# Patient Record
Sex: Female | Born: 1955 | Race: White | Hispanic: No | Marital: Married | Smoking: Former smoker
Health system: Southern US, Community
[De-identification: ages and names within clinical notes are randomized; demographics above are authoritative.]

## PROBLEM LIST (undated history)

## (undated) DIAGNOSIS — J309 Allergic rhinitis, unspecified: Secondary | ICD-10-CM

## (undated) DIAGNOSIS — F410 Panic disorder [episodic paroxysmal anxiety] without agoraphobia: Secondary | ICD-10-CM

## (undated) DIAGNOSIS — F32A Depression, unspecified: Secondary | ICD-10-CM

## (undated) DIAGNOSIS — I10 Essential (primary) hypertension: Secondary | ICD-10-CM

## (undated) DIAGNOSIS — N189 Chronic kidney disease, unspecified: Secondary | ICD-10-CM

## (undated) DIAGNOSIS — E119 Type 2 diabetes mellitus without complications: Secondary | ICD-10-CM

## (undated) DIAGNOSIS — E669 Obesity, unspecified: Secondary | ICD-10-CM

## (undated) DIAGNOSIS — E559 Vitamin D deficiency, unspecified: Secondary | ICD-10-CM

## (undated) DIAGNOSIS — T7840XA Allergy, unspecified, initial encounter: Secondary | ICD-10-CM

## (undated) DIAGNOSIS — D689 Coagulation defect, unspecified: Secondary | ICD-10-CM

## (undated) DIAGNOSIS — M1990 Unspecified osteoarthritis, unspecified site: Secondary | ICD-10-CM

## (undated) DIAGNOSIS — D649 Anemia, unspecified: Secondary | ICD-10-CM

## (undated) DIAGNOSIS — F419 Anxiety disorder, unspecified: Secondary | ICD-10-CM

## (undated) DIAGNOSIS — E785 Hyperlipidemia, unspecified: Secondary | ICD-10-CM

## (undated) DIAGNOSIS — F319 Bipolar disorder, unspecified: Secondary | ICD-10-CM

## (undated) DIAGNOSIS — K219 Gastro-esophageal reflux disease without esophagitis: Secondary | ICD-10-CM

## (undated) HISTORY — DX: Gastro-esophageal reflux disease without esophagitis: K21.9

## (undated) HISTORY — DX: Essential (primary) hypertension: I10

## (undated) HISTORY — DX: Panic disorder (episodic paroxysmal anxiety): F41.0

## (undated) HISTORY — DX: Hyperlipidemia, unspecified: E78.5

## (undated) HISTORY — DX: Vitamin D deficiency, unspecified: E55.9

## (undated) HISTORY — DX: Unspecified osteoarthritis, unspecified site: M19.90

## (undated) HISTORY — DX: Allergy, unspecified, initial encounter: T78.40XA

## (undated) HISTORY — DX: Chronic kidney disease, unspecified: N18.9

## (undated) HISTORY — PX: JOINT REPLACEMENT: SHX530

## (undated) HISTORY — DX: Type 2 diabetes mellitus without complications: E11.9

## (undated) HISTORY — DX: Bipolar disorder, unspecified: F31.9

## (undated) HISTORY — DX: Anxiety disorder, unspecified: F41.9

## (undated) HISTORY — DX: Depression, unspecified: F32.A

## (undated) HISTORY — DX: Obesity, unspecified: E66.9

## (undated) HISTORY — DX: Coagulation defect, unspecified: D68.9

## (undated) HISTORY — DX: Allergic rhinitis, unspecified: J30.9

## (undated) HISTORY — DX: Anemia, unspecified: D64.9

---

## 2003-05-24 HISTORY — PX: HERNIA REPAIR: SHX51

## 2003-05-24 HISTORY — PX: CHOLECYSTECTOMY: SHX55

## 2004-08-11 ENCOUNTER — Ambulatory Visit: Payer: Self-pay | Admitting: Family Medicine

## 2004-09-30 ENCOUNTER — Ambulatory Visit: Payer: Self-pay | Admitting: Family Medicine

## 2004-10-14 ENCOUNTER — Ambulatory Visit: Payer: Self-pay | Admitting: Family Medicine

## 2015-05-31 DIAGNOSIS — R079 Chest pain, unspecified: Secondary | ICD-10-CM

## 2015-05-31 HISTORY — DX: Chest pain, unspecified: R07.9

## 2016-02-09 DIAGNOSIS — R000 Tachycardia, unspecified: Secondary | ICD-10-CM | POA: Insufficient documentation

## 2016-02-09 DIAGNOSIS — E785 Hyperlipidemia, unspecified: Secondary | ICD-10-CM

## 2016-02-09 DIAGNOSIS — Z86711 Personal history of pulmonary embolism: Secondary | ICD-10-CM

## 2016-02-09 HISTORY — DX: Personal history of pulmonary embolism: Z86.711

## 2016-02-09 HISTORY — DX: Tachycardia, unspecified: R00.0

## 2016-02-09 HISTORY — DX: Hyperlipidemia, unspecified: E78.5

## 2021-10-15 ENCOUNTER — Other Ambulatory Visit: Payer: Self-pay

## 2021-10-15 DIAGNOSIS — N1832 Chronic kidney disease, stage 3b: Secondary | ICD-10-CM

## 2021-11-04 ENCOUNTER — Ambulatory Visit
Admission: RE | Admit: 2021-11-04 | Discharge: 2021-11-04 | Disposition: A | Discharge status: Home / Self Care | Payer: Self-pay | Source: Ambulatory Visit | Attending: Nephrology | Admitting: Nephrology

## 2021-11-04 DIAGNOSIS — N1832 Chronic kidney disease, stage 3b: Secondary | ICD-10-CM

## 2021-11-09 ENCOUNTER — Other Ambulatory Visit: Payer: Self-pay | Admitting: Nephrology

## 2021-11-09 DIAGNOSIS — N2889 Other specified disorders of kidney and ureter: Secondary | ICD-10-CM

## 2021-11-11 ENCOUNTER — Other Ambulatory Visit (HOSPITAL_COMMUNITY): Payer: Self-pay | Admitting: Nephrology

## 2021-11-11 DIAGNOSIS — N2889 Other specified disorders of kidney and ureter: Secondary | ICD-10-CM

## 2021-11-20 ENCOUNTER — Ambulatory Visit (HOSPITAL_COMMUNITY)
Admission: RE | Admit: 2021-11-20 | Discharge: 2021-11-20 | Disposition: A | Discharge status: Home / Self Care | Payer: Medicare HMO | Source: Ambulatory Visit | Attending: Nephrology | Admitting: Nephrology

## 2021-11-20 DIAGNOSIS — N2889 Other specified disorders of kidney and ureter: Secondary | ICD-10-CM | POA: Insufficient documentation

## 2021-11-20 MED ORDER — GADOBUTROL 1 MMOL/ML IV SOLN
10.0000 mL | Freq: Once | INTRAVENOUS | Status: AC | PRN
  Administered 2021-11-20: 10 mL via INTRAVENOUS

## 2021-12-27 ENCOUNTER — Telehealth: Payer: Self-pay

## 2021-12-27 NOTE — Telephone Encounter (Signed)
New patient appointment has been scheduled and mychart message was sent to patient with appointment information, along with new patient paperwork.

## 2022-01-07 LAB — HM DIABETES EYE EXAM

## 2022-01-20 ENCOUNTER — Encounter: Payer: Self-pay | Admitting: Cardiology

## 2022-01-20 ENCOUNTER — Encounter: Payer: Self-pay | Admitting: *Deleted

## 2022-01-20 DIAGNOSIS — E785 Hyperlipidemia, unspecified: Secondary | ICD-10-CM | POA: Insufficient documentation

## 2022-01-20 DIAGNOSIS — F319 Bipolar disorder, unspecified: Secondary | ICD-10-CM | POA: Insufficient documentation

## 2022-01-20 DIAGNOSIS — I10 Essential (primary) hypertension: Secondary | ICD-10-CM | POA: Insufficient documentation

## 2022-01-20 DIAGNOSIS — F419 Anxiety disorder, unspecified: Secondary | ICD-10-CM | POA: Insufficient documentation

## 2022-01-20 DIAGNOSIS — E119 Type 2 diabetes mellitus without complications: Secondary | ICD-10-CM | POA: Insufficient documentation

## 2022-01-20 DIAGNOSIS — K219 Gastro-esophageal reflux disease without esophagitis: Secondary | ICD-10-CM | POA: Insufficient documentation

## 2022-01-20 DIAGNOSIS — F410 Panic disorder [episodic paroxysmal anxiety] without agoraphobia: Secondary | ICD-10-CM | POA: Insufficient documentation

## 2022-01-20 DIAGNOSIS — E1122 Type 2 diabetes mellitus with diabetic chronic kidney disease: Secondary | ICD-10-CM | POA: Insufficient documentation

## 2022-01-27 ENCOUNTER — Ambulatory Visit: Payer: Medicare HMO | Admitting: Nurse Practitioner

## 2022-02-07 ENCOUNTER — Ambulatory Visit: Payer: Medicare HMO | Admitting: Cardiology

## 2022-02-07 ENCOUNTER — Telehealth: Payer: Self-pay

## 2022-02-07 ENCOUNTER — Ambulatory Visit: Payer: Medicare HMO | Attending: Cardiology | Admitting: Cardiology

## 2022-02-07 ENCOUNTER — Encounter: Payer: Self-pay | Admitting: Cardiology

## 2022-02-07 VITALS — BP 128/80 | HR 67 | Ht 62.0 in | Wt 202.0 lb

## 2022-02-07 DIAGNOSIS — Z86711 Personal history of pulmonary embolism: Secondary | ICD-10-CM

## 2022-02-07 DIAGNOSIS — R0609 Other forms of dyspnea: Secondary | ICD-10-CM | POA: Diagnosis not present

## 2022-02-07 DIAGNOSIS — I10 Essential (primary) hypertension: Secondary | ICD-10-CM

## 2022-02-07 DIAGNOSIS — Z01810 Encounter for preprocedural cardiovascular examination: Secondary | ICD-10-CM

## 2022-02-07 DIAGNOSIS — F419 Anxiety disorder, unspecified: Secondary | ICD-10-CM

## 2022-02-07 DIAGNOSIS — E785 Hyperlipidemia, unspecified: Secondary | ICD-10-CM

## 2022-02-07 DIAGNOSIS — E119 Type 2 diabetes mellitus without complications: Secondary | ICD-10-CM

## 2022-02-07 NOTE — Patient Instructions (Signed)
Medication Instructions:  Your physician recommends that you continue on your current medications as directed. Please refer to the Current Medication list given to you today.  *If you need a refill on your cardiac medications before your next appointment, please call your pharmacy*   Lab Work: None ordered If you have labs (blood work) drawn today and your tests are completely normal, you will receive your results only by: [place] (if you have MyChart) OR A paper copy in the mail If you have any lab test that is abnormal or we need to change your treatment, we will call you to review the results.   Testing/Procedures: Your physician has requested that you have a lexiscan myoview. For further information please visit [email or web address]. Please follow instruction sheet, as given.  The test will take approximately 3 to 4 hours to complete; you may bring reading material.  If someone comes with you to your appointment, they will need to remain in the main lobby due to limited space in the testing area.   How to prepare for your Myocardial Perfusion Test: Do not eat or drink 3 hours prior to your test, except you may have water. Do not consume products containing caffeine (regular or decaffeinated) 12 hours prior to your test. (ex: coffee, chocolate, sodas, tea). Do bring a list of your current medications with you.  If not listed below, you may take your medications as normal. Do wear comfortable clothes (no dresses or overalls) and walking shoes, tennis shoes preferred (No heels or open toe shoes are allowed). Do NOT wear cologne, perfume, aftershave, or lotions (deodorant is allowed). If these instructions are not followed, your test will have to be rescheduled.  Your physician has requested that you have an echocardiogram. Echocardiography is a painless test that uses sound waves to create images of your heart. It provides your doctor with information about the size and shape of  your heart and how well your heart's chambers and valves are working. This procedure takes approximately one hour. There are no restrictions for this procedure.   Follow-Up: At [facility], you and your health needs are our priority.  As part of our continuing mission to provide you with exceptional heart care, we have created designated Provider Care Teams.  These Care Teams include your primary Cardiologist (physician) and Advanced Practice Providers (APPs -  Physician Assistants and Nurse Practitioners) who all work together to provide you with the care you need, when you need it.  We recommend signing up for the patient portal called "MyChart".  Sign up information is provided on this After Visit Summary.  MyChart is used to connect with patients for Virtual Visits (Telemedicine).  Patients are able to view lab/test results, encounter notes, upcoming appointments, etc.  Non-urgent messages can be sent to your provider as well.   To learn more about what you can do with MyChart, go to [email or web address].    Your next appointment:   5 month(s)  The format for your next appointment:   In Person  Provider:   Jenne Campus, MD   Other Instructions Cardiac Nuclear Scan A cardiac nuclear scan is a test that is done to check the flow of blood to your heart. It is done when you are resting and when you are exercising. The test looks for problems such as: Not enough blood reaching a portion of the heart. The heart muscle not working as it should. You may need this test if: You have heart disease. You have  had lab results that are not normal. You have had heart surgery or a balloon procedure to open up blocked arteries (angioplasty). You have chest pain. You have shortness of breath. In this test, a special dye (tracer) is put into your bloodstream. The tracer will travel to your heart. A camera will then take pictures of your heart to see how the tracer moves through your heart.  This test is usually done at a hospital and takes 2-4 hours. Tell a doctor about: Any allergies you have. All medicines you are taking, including vitamins, herbs, eye drops, creams, and over-the-counter medicines. Any problems you or family members have had with anesthetic medicines. Any blood disorders you have. Any surgeries you have had. Any medical conditions you have. Whether you are pregnant or may be pregnant. What are the risks? Generally, this is a safe test. However, problems may occur, such as: Serious chest pain and heart attack. This is only a risk if the stress portion of the test is done. Rapid heartbeat. A feeling of warmth in your chest. This feeling usually does not last long. Allergic reaction to the tracer. What happens before the test? Ask your doctor about changing or stopping your normal medicines. This is important. Follow instructions from your doctor about what you cannot eat or drink. Remove your jewelry on the day of the test. What happens during the test? An IV tube will be inserted into one of your veins. Your doctor will give you a small amount of tracer through the IV tube. You will wait for 20-40 minutes while the tracer moves through your bloodstream. Your heart will be monitored with an electrocardiogram (ECG). You will lie down on an exam table. Pictures of your heart will be taken for about 15-20 minutes. You may also have a stress test. For this test, one of these things may be done: You will be asked to exercise on a treadmill or a stationary bike. You will be given medicines that will make your heart work harder. This is done if you are unable to exercise. When blood flow to your heart has peaked, a tracer will again be given through the IV tube. After 20-40 minutes, you will get back on the exam table. More pictures will be taken of your heart. Depending on the tracer that is used, more pictures may need to be taken 3-4 hours later. Your IV  tube will be removed when the test is over. The test may vary among doctors and hospitals. What happens after the test? Ask your doctor: Whether you can return to your normal schedule, including diet, activities, and medicines. Whether you should drink more fluids. This will help to remove the tracer from your body. Drink enough fluid to keep your pee (urine) pale yellow. Ask your doctor, or the department that is doing the test: When will my results be ready? How will I get my results? Summary A cardiac nuclear scan is a test that is done to check the flow of blood to your heart. Tell your doctor whether you are pregnant or may be pregnant. Before the test, ask your doctor about changing or stopping your normal medicines. This is important. Ask your doctor whether you can return to your normal activities. You may be asked to drink more fluids. This information is not intended to replace advice given to you by your health care provider. Make sure you discuss any questions you have with your health care provider. Document Revised: 08/29/2018 Document Reviewed: 10/23/2017  Elsevier Patient Education  2021 [place].    Echocardiogram An echocardiogram is a test that uses sound waves (ultrasound) to produce images of the heart. Images from an echocardiogram can provide important information about: Heart size and shape. The size and thickness and movement of your heart's walls. Heart muscle function and strength. Heart valve function or if you have stenosis. Stenosis is when the heart valves are too narrow. If blood is flowing backward through the heart valves (regurgitation). A tumor or infectious growth around the heart valves. Areas of heart muscle that are not working well because of poor blood flow or injury from a heart attack. Aneurysm detection. An aneurysm is a weak or damaged part of an artery wall. The wall bulges out from the normal force of blood pumping through the  body. Tell a health care provider about: Any allergies you have. All medicines you are taking, including vitamins, herbs, eye drops, creams, and over-the-counter medicines. Any blood disorders you have. Any surgeries you have had. Any medical conditions you have. Whether you are pregnant or may be pregnant. What are the risks? Generally, this is a safe test. However, problems may occur, including an allergic reaction to dye (contrast) that may be used during the test. What happens before the test? No specific preparation is needed. You may eat and drink normally. What happens during the test? You will take off your clothes from the waist up and put on a hospital gown. Electrodes or electrocardiogram (ECG)patches may be placed on your chest. The electrodes or patches are then connected to a device that monitors your heart rate and rhythm. You will lie down on a table for an ultrasound exam. A gel will be applied to your chest to help sound waves pass through your skin. A handheld device, called a transducer, will be pressed against your chest and moved over your heart. The transducer produces sound waves that travel to your heart and bounce back (or "echo" back) to the transducer. These sound waves will be captured in real-time and changed into images of your heart that can be viewed on a video monitor. The images will be recorded on a computer and reviewed by your health care provider. You may be asked to change positions or hold your breath for a short time. This makes it easier to get different views or better views of your heart. In some cases, you may receive contrast through an IV in one of your veins. This can improve the quality of the pictures from your heart. The procedure may vary among health care providers and hospitals.    What can I expect after the test? You may return to your normal, everyday life, including diet, activities, and medicines, unless your health care provider tells  you not to do that. Follow these instructions at home: It is up to you to get the results of your test. Ask your health care provider, or the department that is doing the test, when your results will be ready. Keep all follow-up visits. This is important. Summary An echocardiogram is a test that uses sound waves (ultrasound) to produce images of the heart. Images from an echocardiogram can provide important information about the size and shape of your heart, heart muscle function, heart valve function, and other possible heart problems. You do not need to do anything to prepare before this test. You may eat and drink normally. After the echocardiogram is completed, you may return to your normal, everyday life, unless your  health care provider tells you not to do that. This information is not intended to replace advice given to you by your health care provider. Make sure you discuss any questions you have with your health care provider. Document Revised: 12/31/2019 Document Reviewed: 12/31/2019 Elsevier Patient Education  2021 [employer].

## 2022-02-07 NOTE — Telephone Encounter (Signed)
   [facility] Pre-operative Risk Assessment    Request for surgical clearance:  What type of surgery is being performed? Right TKR   When is this surgery scheduled? TBD   What type of clearance is required (medical clearance vs. Pharmacy clearance to hold med vs. Both)? Both  Are there any medications that need to be held prior to surgery and how long?Not specified   Practice name and name of physician performing surgery? Dr. Lara Mulch at [facility]   What is your office phone number: [phone]    7.   What is your office fax number: [phone] and [phone]  8.   Anesthesia type (None, local, MAC, general) ? Spinal Anesthesia    Basil Dess Rudra Hobbins 02/07/2022, 1:53 PM  _________________________________________________________________   (provider comments below)

## 2022-02-07 NOTE — Progress Notes (Unsigned)
Cardiology Consultation:    Date:  02/07/2022   ID:  Dorris Fetch, DOB Nov 10, 1955, MRN [id number]  PCP:  Rhea Bleacher, NP  Cardiologist:  Jenne Campus, MD   Referring MD: Rhea Bleacher, NP   Chief Complaint  Patient presents with   CLearance TBD    Dr. Lara Mulch, Right total knee replacement     History of Present Illness:    Rachel Russell is a 66 y.o. female who is being seen today for the evaluation of cardiovascular valuation before elective right knee replacement surgery at the request of Rhea Bleacher, NP.  I had a chance to take care of her about 8 years ago.  She presented to [place] because of tachycardia.  Echocardiogram has been performed which showed preserved left ventricle ejection fraction.  Her past medical history includes pulmonary emboli that she suffered from in 2006, essential hypertension, dyslipidemia, diabetes.  Overall her ability to exercise is very limited and the multiple reasons for that.  Obviously right knee pain is significant.  She described to have some shortness of breath with exertion however will stop her from walking more is pain in right knee rather than chest pain tightness squeezing pressure burning chest.  She denies have any palpitations, there is no swelling of lower extremities.  She does not smoke, never did.  Does have dyslipidemia and she is on pravastatin but I do not have her last fasting lipid profile.  Past Medical History:  Diagnosis Date   Allergic rhinitis    Anxiety disorder    Bipolar disorder ([place])    Chest pain 05/31/2015   Formatting of this note might be different from the original. Seen at [facility] ED 04/12/15 with normal EKG, troponin-2, CTA chest and discharged   Dyslipidemia 02/09/2016   Essential (primary) hypertension    GERD without esophagitis    History of pulmonary embolism 02/09/2016   Hyperlipidemia    Obesity    Panic disorder (episodic paroxysmal anxiety)    Tachycardia 02/09/2016   Type 2 diabetes  mellitus without complication ([place])    Vitamin D deficiency     Past Surgical History:  Procedure Laterality Date   CHOLECYSTECTOMY  2005    Current Medications: Current Meds  Medication Sig   famotidine (PEPCID) 20 MG tablet Take 1 tablet by mouth daily.   fexofenadine (ALLEGRA) 180 MG tablet Take 180 mg by mouth daily.   hydrochlorothiazide (HYDRODIURIL) 12.5 MG tablet Take 12.5 mg by mouth daily.   lamoTRIgine (LAMICTAL) 100 MG tablet Take 300 mg by mouth daily. 1 in am and 2 in pm   LORazepam (ATIVAN) 0.5 MG tablet Take 0.5 mg by mouth 2 (two) times daily as needed for anxiety.   metFORMIN (GLUCOPHAGE-XR) 500 MG 24 hr tablet Take 500 mg by mouth daily.   PARoxetine (PAXIL) 40 MG tablet Take 40 mg by mouth at bedtime.   pravastatin (PRAVACHOL) 80 MG tablet Take 80 mg by mouth daily.   propranolol ER (INDERAL LA) 60 MG 24 hr capsule Take 60 mg by mouth daily.   Rivaroxaban (XARELTO) 15 MG TABS tablet Take 15 mg by mouth daily with supper.   traZODone (DESYREL) 50 MG tablet Take 150 mg by mouth at bedtime as needed for sleep.     Allergies:   Iodinated contrast media, Shellfish-derived products, Codeine, Latex, and Penicillins   Social History   Socioeconomic History   Marital status: Married    Spouse name: Not on file   Number  of children: Not on file   Years of education: Not on file   Highest education level: Some college, no degree  Occupational History   Not on file  Tobacco Use   Smoking status: Former    Years: 5.00    Types: Cigarettes    Quit date: 11/2018    Years since quitting: 3.2   Smokeless tobacco: Never  Vaping Use   Vaping Use: Never used  Substance and Sexual Activity   Alcohol use: Never   Drug use: Never   Sexual activity: Not on file  Other Topics Concern   Not on file  Social History Narrative   Not on file   Social Determinants of Health   Financial Resource Strain: High Risk (12/27/2021)   Overall Financial Resource Strain (CARDIA)     Difficulty of Paying Living Expenses: Hard  Food Insecurity: Food Insecurity Present (12/27/2021)   Hunger Vital Sign    Worried About Running Out of Food in the Last Year: Sometimes true    Ran Out of Food in the Last Year: Sometimes true  Transportation Needs: No Transportation Needs (12/27/2021)   PRAPARE - Administrator, Civil Service (Medical): No    Lack of Transportation (Non-Medical): No  Physical Activity: Unknown (12/27/2021)   Exercise Vital Sign    Days of Exercise per Week: 0 days    Minutes of Exercise per Session: Not on file  Stress: Stress Concern Present (12/27/2021)   [employer] of Occupational Health - Occupational Stress Questionnaire    Feeling of Stress : Very much  Social Connections: Moderately Isolated (12/27/2021)   Social Connection and Isolation Panel [NHANES]    Frequency of Communication with Friends and Family: Three times a week    Frequency of Social Gatherings with Friends and Family: Never    Attends Religious Services: Never    Database administrator or Organizations: No    Attends Engineer, structural: Not on file    Marital Status: Married     Family History: The patient's family history includes Asthma in her brother; Diabetes in her father; Heart disease in her father and mother; Leukemia in her mother. ROS:   Please see the history of present illness.    All 14 point review of systems negative except as described per history of present illness.  EKGs/Labs/Other Studies Reviewed:    The following studies were reviewed today:   EKG:  EKG is  ordered today.  The ekg ordered today demonstrates normal sinus rhythm normal P interval normal QS complex duration fulgent no ST segment changes  Recent Labs: No results found for requested labs within last 365 days.  Recent Lipid Panel No results found for: "CHOL", "TRIG", "HDL", "CHOLHDL", "VLDL", "LDLCALC", "LDLDIRECT"  Physical Exam:    VS:  BP 128/80 (BP Location: Left  Arm, Patient Position: Sitting)   Pulse 67   Ht 5\' 2"  (1.575 m)   Wt 202 lb (91.6 kg)   SpO2 99%   BMI 36.95 kg/m     Wt Readings from Last 3 Encounters:  02/07/22 202 lb (91.6 kg)  01/05/22 203 lb (92.1 kg)     GEN:  Well nourished, well developed in no acute distress HEENT: Normal NECK: No JVD; No carotid bruits LYMPHATICS: No lymphadenopathy CARDIAC: RRR, no murmurs, no rubs, no gallops RESPIRATORY:  Clear to auscultation without rales, wheezing or rhonchi  ABDOMEN: Soft, non-tender, non-distended MUSCULOSKELETAL:  No edema; No deformity  SKIN: Warm and dry  NEUROLOGIC:  Alert and oriented x 3 PSYCHIATRIC:  Normal affect   ASSESSMENT:    1. History of pulmonary embolism   2. Dyslipidemia   3. Essential (primary) hypertension   4. Type 2 diabetes mellitus without complication, without long-term current use of insulin ([place])   5. Anxiety disorder, unspecified type    PLAN:    In order of problems listed above:  History of pulmonary emboli.  She is anticoagulated which I will continue.  I will get echocardiogram to assess right ventricle size and function. This could edema she is on pravastatin which I will continue we will call primary care physician to get fasting lipid profile Essential hypertension blood pressure well controlled continue present management Type 2 diabetes stable apparently Anxiety/panic disorder can contribute to her symptomatology. Cardiovascular evaluation before elective knee replacement surgery.  I will ask her to have an echocardiogram done, we will also schedule her to have Lexiscan to make sure she does not have any inducible ischemia.  If everything is normal then we will be able to hold her Xarelto for 48 hours and proceed with surgery as scheduled.   Medication Adjustments/Labs and Tests Ordered: Current medicines are reviewed at length with the patient today.  Concerns regarding medicines are outlined above.  No orders of the defined types  were placed in this encounter.  No orders of the defined types were placed in this encounter.   Signed, Park Liter, MD, [facility]. 02/07/2022 2:23 PM    [place]

## 2022-02-08 ENCOUNTER — Telehealth (HOSPITAL_COMMUNITY): Payer: Self-pay | Admitting: *Deleted

## 2022-02-08 NOTE — Telephone Encounter (Signed)
Left message on voicemail per DPR in reference to upcoming appointment scheduled on  02/15/22 with detailed instructions given per Myocardial Perfusion Study Information Sheet for the test. LM to arrive 15 minutes early, and that it is imperative to arrive on time for appointment to keep from having the test rescheduled. If you need to cancel or reschedule your appointment, please call the office within 24 hours of your appointment. Failure to do so may result in a cancellation of your appointment, and a $50 no show fee. Phone number given for call back for any questions. Lynn Recendiz Jacqueline   

## 2022-02-09 ENCOUNTER — Ambulatory Visit: Payer: Medicare HMO | Admitting: Nurse Practitioner

## 2022-02-10 NOTE — Telephone Encounter (Signed)
Pt is having some cardiac testing, 02/15/22 & 02/17/22, clearance will be addressed at that time.  Will route to requesting surgeon's office to make them aware.

## 2022-02-15 ENCOUNTER — Ambulatory Visit: Payer: Medicare HMO

## 2022-02-17 ENCOUNTER — Telehealth (HOSPITAL_COMMUNITY): Payer: Self-pay | Admitting: *Deleted

## 2022-02-17 ENCOUNTER — Ambulatory Visit: Payer: Medicare HMO

## 2022-02-17 NOTE — Telephone Encounter (Signed)
Patient given detailed instructions per Myocardial Perfusion Study Information Sheet for the test on 02/24/2022 at 8:15. Patient notified to arrive 15 minutes early and that it is imperative to arrive on time for appointment to keep from having the test rescheduled.  If you need to cancel or reschedule your appointment, please call the office within 24 hours of your appointment. . Patient verbalized understanding.Rachel Russell

## 2022-02-23 ENCOUNTER — Encounter: Payer: Self-pay | Admitting: Nurse Practitioner

## 2022-02-23 ENCOUNTER — Ambulatory Visit (INDEPENDENT_AMBULATORY_CARE_PROVIDER_SITE_OTHER): Payer: Medicare HMO | Admitting: Nurse Practitioner

## 2022-02-23 VITALS — BP 118/78 | HR 63 | Temp 97.1°F | Ht 62.0 in | Wt 203.0 lb

## 2022-02-23 DIAGNOSIS — I10 Essential (primary) hypertension: Secondary | ICD-10-CM | POA: Diagnosis not present

## 2022-02-23 DIAGNOSIS — Z780 Asymptomatic menopausal state: Secondary | ICD-10-CM

## 2022-02-23 DIAGNOSIS — Z1231 Encounter for screening mammogram for malignant neoplasm of breast: Secondary | ICD-10-CM

## 2022-02-23 DIAGNOSIS — Z23 Encounter for immunization: Secondary | ICD-10-CM

## 2022-02-23 DIAGNOSIS — Z5320 Procedure and treatment not carried out because of patient's decision for unspecified reasons: Secondary | ICD-10-CM

## 2022-02-23 DIAGNOSIS — F419 Anxiety disorder, unspecified: Secondary | ICD-10-CM | POA: Diagnosis not present

## 2022-02-23 DIAGNOSIS — E1165 Type 2 diabetes mellitus with hyperglycemia: Secondary | ICD-10-CM | POA: Diagnosis not present

## 2022-02-23 DIAGNOSIS — E782 Mixed hyperlipidemia: Secondary | ICD-10-CM

## 2022-02-23 DIAGNOSIS — K219 Gastro-esophageal reflux disease without esophagitis: Secondary | ICD-10-CM

## 2022-02-23 DIAGNOSIS — Z13820 Encounter for screening for osteoporosis: Secondary | ICD-10-CM

## 2022-02-23 MED ORDER — TETANUS-DIPHTH-ACELL PERTUSSIS 5-2.5-18.5 LF-MCG/0.5 IM SUSP: 0.5000 mL | Freq: Once | INTRAMUSCULAR | 0 refills | Status: AC

## 2022-02-23 MED ORDER — ZOSTER VAC RECOMB ADJUVANTED 50 MCG/0.5ML IM SUSR: 0.5000 mL | Freq: Once | INTRAMUSCULAR | 0 refills | Status: AC

## 2022-02-23 NOTE — Patient Instructions (Addendum)
We will call you with lab results, mammogram, and bone mineral density exams Continue medications [facility]btain [facility] Follow-up in 38-month, pending labs    Preventive Care 65 Years and Older, Female Preventive care refers to lifestyle choices and visits with your health care provider that can promote health and wellness. Preventive care visits are also called wellness exams. What can I expect for my preventive care visit? Counseling Your health care provider may ask you questions about your: Medical history, including: Past medical problems. Family medical history. Pregnancy and menstrual history. History of falls. Current health, including: Memory and ability to understand (cognition). Emotional well-being. Home life and relationship well-being. Sexual activity and sexual health. Lifestyle, including: Alcohol, nicotine or tobacco, and drug use. Access to firearms. Diet, exercise, and sleep habits. Work and work eStatistician Sunscreen use. Safety issues such as seatbelt and bike helmet use. Physical exam Your health care provider will check your: Height and weight. These may be used to calculate your BMI (body mass index). BMI is a measurement that tells if you are at a healthy weight. Waist circumference. This measures the distance around your waistline. This measurement also tells if you are at a healthy weight and may help predict your risk of certain diseases, such as type 2 diabetes and high blood pressure. Heart rate and blood pressure. Body temperature. Skin for abnormal spots. What immunizations do I need?  Vaccines are usually given at various ages, according to a schedule. Your health care provider will recommend vaccines for you based on your age, medical history, and lifestyle or other factors, such as travel or where you work. What tests do I need? Screening Your health care provider may recommend screening tests for certain  conditions. This may include: Lipid and cholesterol levels. Hepatitis C test. Hepatitis B test. HIV (human immunodeficiency virus) test. STI (sexually transmitted infection) testing, if you are at risk. Lung cancer screening. Colorectal cancer screening. Diabetes screening. This is done by checking your blood sugar (glucose) after you have not eaten for a while (fasting). Mammogram. Talk with your health care provider about how often you should have regular mammograms. BRCA-related cancer screening. This may be done if you have a family history of breast, ovarian, tubal, or peritoneal cancers. Bone density scan. This is done to screen for osteoporosis. Talk with your health care provider about your test results, treatment options, and if necessary, the need for more tests. Follow these instructions at home: Eating and drinking  Eat a diet that includes fresh fruits and vegetables, whole grains, lean protein, and low-fat dairy products. Limit your intake of foods with high amounts of sugar, saturated fats, and salt. Take vitamin and mineral supplements as recommended by your health care provider. Do not drink alcohol if your health care provider tells you not to drink. If you drink alcohol: Limit how much you have to 0-1 drink a day. Know how much alcohol is in your drink. In the [place] one drink equals one 12 oz bottle of beer (355 mL), one 5 oz glass of wine (148 mL), or one 1 oz glass of hard liquor (44 mL). Lifestyle Brush your teeth every morning and night with fluoride toothpaste. Floss one time each day. Exercise for at least 30 minutes 5 or more days each week. Do not use any products that contain nicotine or tobacco. These products include cigarettes, chewing tobacco, and vaping devices, such as e-cigarettes. If you need help quitting, ask your health care provider.  Do not use drugs. If you are sexually active, practice safe sex. Use a condom or other form of protection in order to  prevent STIs. Take aspirin only as told by your health care provider. Make sure that you understand how much to take and what form to take. Work with your health care provider to find out whether it is safe and beneficial for you to take aspirin daily. Ask your health care provider if you need to take a cholesterol-lowering medicine (statin). Find healthy ways to manage stress, such as: Meditation, yoga, or listening to music. Journaling. Talking to a trusted person. Spending time with friends and family. Minimize exposure to UV radiation to reduce your risk of skin cancer. Safety Always wear your seat belt while driving or riding in a vehicle. Do not drive: If you have been drinking alcohol. Do not ride with someone who has been drinking. When you are tired or distracted. While texting. If you have been using any mind-altering substances or drugs. Wear a helmet and other protective equipment during sports activities. If you have firearms in your house, make sure you follow all gun safety procedures. What's next? Visit your health care provider once a year for an annual wellness visit. Ask your health care provider how often you should have your eyes and teeth checked. Stay up to date on all vaccines. This information is not intended to replace advice given to you by your health care provider. Make sure you discuss any questions you have with your health care provider. Document Revised: 11/04/2020 Document Reviewed: 11/04/2020 Elsevier Patient Education  [place].

## 2022-02-23 NOTE — Progress Notes (Signed)
New Patient Office Visit  Subjective    Patient ID: Rachel Russell, female    DOB: December 20, 1955  Age: 66 y.o. MRN: [id number]  CC:  Chief Complaint  Patient presents with   Establish Care    HPI Rachel Russell presents to establish care previous patient with [facility]. She tells me she suffers from bipolar disorder and see's Dr. Delane Ginger, currently prescribed Lamictal, Trazadone, Paxil, and Ativan.Pt reports chronic right knee pain secondary to osteoarthritis. States she has is followed by Dr Deberah Castle and plans to undergo right TKA in the near future.   Diabetes Mellitus Type II, Follow-up   Wt Readings from Last 3 Encounters:  02/23/22 203 lb (92.1 kg)  02/07/22 202 lb (91.6 kg)  01/05/22 203 lb (92.1 kg)   Management  includes Metformin 500 mg daily. She reports excellent compliance with treatment. She is not having side effects.   Most Recent Eye Exam: 01/2022  Lipid/Cholesterol, Follow-up  Last lipid panel Other pertinent labs  No results found for: "CHOL", "HDL", "LDLCALC", "LDLDIRECT", "TRIG", "CHOLHDL" No results found for: "ALT", "AST", "PLT", "TSH"   Management includes Pravastatin 80 mg QD         She reports excellent compliance with treatment. She is not having side effects.    Bipolar Depression:  Current treatment includes Trazodone, Lamictal  She reports excellent compliance with treatment. She is not having side effects.       02/23/2022   11:09 AM  Depression screen PHQ 2/9  Decreased Interest 3  Down, Depressed, Hopeless 3  PHQ - 2 Score 6  Altered sleeping 0  Tired, decreased energy 2  Change in appetite 3  Feeling bad or failure about yourself  3  Trouble concentrating 3  Moving slowly or fidgety/restless 3  Suicidal thoughts 0  PHQ-9 Score 20  Difficult doing work/chores Very difficult    Anxiety, Follow-up  Current treatment includes Ativan and Propranolol.  She reports excellent compliance with treatment. She  reports excellent tolerance of treatment. She is not having side effects.  She feels her anxiety is severe.   GAD-7 Results    02/23/2022   11:11 AM  GAD-7 Generalized Anxiety Disorder Screening Tool  1. Feeling Nervous, Anxious, or on Edge 3  2. Not Being Able to Stop or Control Worrying 3  3. Worrying Too Much About Different Things 3  4. Trouble Relaxing 0  5. Being So Restless it's Hard To Sit Still 1  6. Becoming Easily Annoyed or Irritable 3  7. Feeling Afraid As If Something Awful Might Happen 2  Total GAD-7 Score 15  Difficulty At Work, Home, or Getting  Along With Others? Very difficult      Outpatient Encounter Medications as of 02/23/2022  Medication Sig   famotidine (PEPCID) 20 MG tablet Take 1 tablet by mouth daily.   fexofenadine (ALLEGRA) 180 MG tablet Take 180 mg by mouth daily.   lamoTRIgine (LAMICTAL) 100 MG tablet Take 300 mg by mouth daily. 1 in am and 2 in pm   LORazepam (ATIVAN) 0.5 MG tablet Take 0.5 mg by mouth 2 (two) times daily as needed for anxiety.   metFORMIN (GLUCOPHAGE-XR) 500 MG 24 hr tablet Take 500 mg by mouth daily.   PARoxetine (PAXIL) 40 MG tablet Take 40 mg by mouth at bedtime.   pravastatin (PRAVACHOL) 80 MG tablet Take 80 mg by mouth daily.   propranolol ER (INDERAL LA) 60 MG 24 hr capsule Take 60 mg by mouth daily.  Rivaroxaban (XARELTO) 15 MG TABS tablet Take 15 mg by mouth daily with supper.   traZODone (DESYREL) 50 MG tablet Take 150 mg by mouth at bedtime as needed for sleep.   [DISCONTINUED] hydrochlorothiazide (HYDRODIURIL) 12.5 MG tablet Take 12.5 mg by mouth daily.   No facility-administered encounter medications on file as of 02/23/2022.    Past Medical History:  Diagnosis Date   Allergic rhinitis    Anxiety disorder    Bipolar disorder (HCC)    Chest pain 05/31/2015   Formatting of this note might be different from the original. Seen at [facility] ED 04/12/15 with normal EKG, troponin-2, CTA chest and discharged   Dyslipidemia  02/09/2016   Essential (primary) hypertension    GERD without esophagitis    History of pulmonary embolism 02/09/2016   Hyperlipidemia    Obesity    Panic disorder (episodic paroxysmal anxiety)    Tachycardia 02/09/2016   Type 2 diabetes mellitus without complication (HCC)    Vitamin D deficiency     Past Surgical History:  Procedure Laterality Date   CHOLECYSTECTOMY  2005    Family History  Problem Relation Age of Onset   Leukemia Mother    Heart disease Mother        Pacemaker   Diabetes Father    Heart disease Father        Stents   Asthma Brother     Social History   Socioeconomic History   Marital status: Married    Spouse name: Tajae Maiolo   Number of children: 3   Years of education: Not on file   Highest education level: Some college, no degree  Occupational History   Not on file  Tobacco Use   Smoking status: Former    Years: 5.00    Types: Cigarettes    Quit date: 11/2018    Years since quitting: 3.2   Smokeless tobacco: Never  Vaping Use   Vaping Use: Every day  Substance and Sexual Activity   Alcohol use: Never   Drug use: Never   Sexual activity: Not Currently  Other Topics Concern   Not on file  Social History Narrative   Not on file   Social Determinants of Health   Financial Resource Strain: High Risk (12/27/2021)   Overall Financial Resource Strain (CARDIA)    Difficulty of Paying Living Expenses: Hard  Food Insecurity: Food Insecurity Present (12/27/2021)   Hunger Vital Sign    Worried About Running Out of Food in the Last Year: Sometimes true    Ran Out of Food in the Last Year: Sometimes true  Transportation Needs: No Transportation Needs (12/27/2021)   PRAPARE - Administrator, Civil Service (Medical): No    Lack of Transportation (Non-Medical): No  Physical Activity: Inactive (02/23/2022)   Exercise Vital Sign    Days of Exercise per Week: 0 days    Minutes of Exercise per Session: 0 min  Stress: Stress Concern  Present (12/27/2021)   [employer] of Occupational Health - Occupational Stress Questionnaire    Feeling of Stress : Very much  Social Connections: Moderately Isolated (12/27/2021)   Social Connection and Isolation Panel [NHANES]    Frequency of Communication with Friends and Family: Three times a week    Frequency of Social Gatherings with Friends and Family: Never    Attends Religious Services: Never    Database administrator or Organizations: No    Attends Banker Meetings: Not on file  Marital Status: Married  Human resources officer Violence: Not At Risk (02/23/2022)   Humiliation, Afraid, Rape, and Kick questionnaire    Fear of Current or Ex-Partner: No    Emotionally Abused: No    Physically Abused: No    Sexually Abused: No    Review of Systems  Constitutional:  Negative for chills, fever and malaise/fatigue.  HENT:  Negative for ear pain, sinus pain and sore throat.   Respiratory:  Negative for cough and shortness of breath.   Cardiovascular:  Negative for chest pain.  Musculoskeletal:  Positive for joint pain (chronic, right knee) and myalgias (right knee).  Neurological:  Negative for headaches.  Endo/Heme/Allergies:  Positive for environmental allergies.  Psychiatric/Behavioral:  Positive for depression and memory loss. The patient is nervous/anxious.         Objective    BP 118/78   Pulse 63   Temp (!) 97.1 F (36.2 C)   Ht 5\' 2"  (1.575 m)   Wt 203 lb (92.1 kg)   SpO2 97%   BMI 37.13 kg/m   Physical Exam Vitals reviewed.  Constitutional:      Appearance: She is obese.  HENT:     Head: Normocephalic.     Right Ear: Tympanic membrane normal.     Left Ear: Tympanic membrane normal.     Nose: Nose normal.     Mouth/Throat:     Mouth: Mucous membranes are moist.  Eyes:     Pupils: Pupils are equal, round, and reactive to light.  Cardiovascular:     Rate and Rhythm: Normal rate and regular rhythm.  Pulmonary:     Effort: Pulmonary effort is  normal.     Breath sounds: Normal breath sounds.  Abdominal:     General: Bowel sounds are normal.     Palpations: Abdomen is soft.  Musculoskeletal:        General: Swelling (right knee) and tenderness (right knee) present.  Skin:    General: Skin is warm and dry.     Capillary Refill: Capillary refill takes less than 2 seconds.  Neurological:     General: No focal deficit present.     Mental Status: She is alert and oriented to person, place, and time.  Psychiatric:        Mood and Affect: Mood normal.        Behavior: Behavior normal.       Assessment & Plan:   1. Type 2 diabetes mellitus with hyperglycemia, without long-term current use of insulin (HCC) - HM DIABETES EYE EXAM - CBC with Differential/Platelet - Comprehensive metabolic panel - Hemoglobin A1c - Lipid panel - TSH - Microalbumin / creatinine urine ratio - VITAMIN D 25 Hydroxy (Vit-D Deficiency, Fractures) - T4, Free - MM DIGITAL SCREENING BILATERAL; Future  2. Essential (primary) hypertension - CBC with Differential/Platelet - Comprehensive metabolic panel - TSH - T4, Free  3. Anxiety disorder, unspecified type - CBC with Differential/Platelet - Comprehensive metabolic panel - TSH - T4, Free  4. Gastroesophageal reflux disease, unspecified whether esophagitis present - CBC with Differential/Platelet - Comprehensive metabolic panel  5. Mixed hyperlipidemia - CBC with Differential/Platelet - Comprehensive metabolic panel - Lipid panel  6. Screening mammogram for breast cancer - MM DIGITAL SCREENING BILATERAL; Future  7. Encounter for osteoporosis screening in asymptomatic postmenopausal patient - DG Bone Density; Future  8. Colonoscopy refused  9. Need for vaccination - Zoster Vaccine Adjuvanted [facility]) injection; Inject 0.5 mLs into the muscle once for 1 dose.  Dispense:  0.5 mL; Refill: 0 - Tdap (BOOSTRIX) 5-2.5-18.5 LF-MCG/0.5 injection; Inject 0.5 mLs into the muscle once for 1  dose.  Dispense: 0.5 mL; Refill: 0     We will call you with lab results, mammogram, and bone mineral density exams Continue medications [facility] Follow-up in 12-months, pending labs  Follow-up: 3 months, fasting  I, Janie Morning, NP, have reviewed all documentation for this visit. The documentation on 03/07/22 for the exam, diagnosis, procedures, and orders are all accurate and complete.   Signed, Flonnie Hailstone, DNP

## 2022-02-24 ENCOUNTER — Ambulatory Visit: Payer: Medicare HMO | Attending: Cardiology

## 2022-02-24 DIAGNOSIS — Z01810 Encounter for preprocedural cardiovascular examination: Secondary | ICD-10-CM

## 2022-02-24 LAB — VITAMIN D 25 HYDROXY (VIT D DEFICIENCY, FRACTURES): Vit D, 25-Hydroxy: 37.4 ng/mL (ref 30.0–100.0)

## 2022-02-24 LAB — CBC WITH DIFFERENTIAL/PLATELET
Basophils Absolute: 0.1 10*3/uL (ref 0.0–0.2)
Basos: 1 %
EOS (ABSOLUTE): 0.2 10*3/uL (ref 0.0–0.4)
Eos: 4 %
Hematocrit: 36.4 % (ref 34.0–46.6)
Hemoglobin: 12.1 g/dL (ref 11.1–15.9)
Immature Grans (Abs): 0 10*3/uL (ref 0.0–0.1)
Immature Granulocytes: 0 %
Lymphocytes Absolute: 2.1 10*3/uL (ref 0.7–3.1)
Lymphs: 35 %
MCH: 29.9 pg (ref 26.6–33.0)
MCHC: 33.2 g/dL (ref 31.5–35.7)
MCV: 90 fL (ref 79–97)
Monocytes Absolute: 0.4 10*3/uL (ref 0.1–0.9)
Monocytes: 6 %
Neutrophils Absolute: 3.3 10*3/uL (ref 1.4–7.0)
Neutrophils: 54 %
Platelets: 288 10*3/uL (ref 150–450)
RBC: 4.05 x10E6/uL (ref 3.77–5.28)
RDW: 12.7 % (ref 11.7–15.4)
WBC: 6.1 10*3/uL (ref 3.4–10.8)

## 2022-02-24 LAB — MYOCARDIAL PERFUSION IMAGING
LV dias vol: 72 mL (ref 46–106)
LV sys vol: 23 mL
Nuc Stress EF: 68 %
Peak HR: 77 {beats}/min
Rest HR: 56 {beats}/min
Rest Nuclear Isotope Dose: 10.8 mCi
SDS: 0
SRS: 0
SSS: 0
Stress Nuclear Isotope Dose: 31.2 mCi
TID: 1.06

## 2022-02-24 LAB — COMPREHENSIVE METABOLIC PANEL
ALT: 15 IU/L (ref 0–32)
AST: 17 IU/L (ref 0–40)
Albumin/Globulin Ratio: 1.9 (ref 1.2–2.2)
Albumin: 4.5 g/dL (ref 3.9–4.9)
Alkaline Phosphatase: 89 IU/L (ref 44–121)
BUN/Creatinine Ratio: 14 (ref 12–28)
BUN: 23 mg/dL (ref 8–27)
Bilirubin Total: <0.2 mg/dL (ref 0.0–1.2)
CO2: 20 mmol/L (ref 20–29)
Calcium: 9.7 mg/dL (ref 8.7–10.3)
Chloride: 106 mmol/L (ref 96–106)
Creatinine, Ser: 1.69 mg/dL — ABNORMAL HIGH (ref 0.57–1.00)
Globulin, Total: 2.4 g/dL (ref 1.5–4.5)
Glucose: 108 mg/dL — ABNORMAL HIGH (ref 70–99)
Potassium: 4.3 mmol/L (ref 3.5–5.2)
Sodium: 141 mmol/L (ref 134–144)
Total Protein: 6.9 g/dL (ref 6.0–8.5)
eGFR: 33 mL/min/{1.73_m2} — ABNORMAL LOW (ref >59)

## 2022-02-24 LAB — LIPID PANEL
Chol/HDL Ratio: 3.5 ratio (ref 0.0–4.4)
Cholesterol, Total: 173 mg/dL (ref 100–199)
HDL: 50 mg/dL (ref >39)
LDL Chol Calc (NIH): 95 mg/dL (ref 0–99)
Triglycerides: 160 mg/dL — ABNORMAL HIGH (ref 0–149)
VLDL Cholesterol Cal: 28 mg/dL (ref 5–40)

## 2022-02-24 LAB — HEMOGLOBIN A1C
Est. average glucose Bld gHb Est-mCnc: 123 mg/dL
Hgb A1c MFr Bld: 5.9 % — ABNORMAL HIGH (ref 4.8–5.6)

## 2022-02-24 LAB — MICROALBUMIN / CREATININE URINE RATIO
Creatinine, Urine: 148.3 mg/dL
Microalb/Creat Ratio: 77 mg/g creat — ABNORMAL HIGH (ref 0–29)
Microalbumin, Urine: 114 ug/mL

## 2022-02-24 LAB — T4, FREE: Free T4: 0.98 ng/dL (ref 0.82–1.77)

## 2022-02-24 LAB — TSH: TSH: 1.07 u[IU]/mL (ref 0.450–4.500)

## 2022-02-24 LAB — CARDIOVASCULAR RISK ASSESSMENT

## 2022-02-24 MED ORDER — TECHNETIUM TC 99M TETROFOSMIN IV KIT
10.8000 | PACK | Freq: Once | INTRAVENOUS | Status: AC | PRN
  Administered 2022-02-24: 10.8 via INTRAVENOUS

## 2022-02-24 MED ORDER — REGADENOSON 0.4 MG/5ML IV SOLN
0.4000 mg | Freq: Once | INTRAVENOUS | Status: AC
  Administered 2022-02-24: 0.4 mg via INTRAVENOUS

## 2022-02-24 MED ORDER — TECHNETIUM TC 99M TETROFOSMIN IV KIT
31.2000 | PACK | Freq: Once | INTRAVENOUS | Status: AC | PRN
  Administered 2022-02-24: 31.2 via INTRAVENOUS

## 2022-02-25 ENCOUNTER — Telehealth: Payer: Self-pay

## 2022-02-25 NOTE — Telephone Encounter (Signed)
LVM and message on My Chart regarding normal results per Dr. Wendy Poet note. Encouraged to call with any questions or concerns. Routed to PCP.

## 2022-02-28 ENCOUNTER — Encounter: Payer: Self-pay | Admitting: Nurse Practitioner

## 2022-03-01 ENCOUNTER — Ambulatory Visit: Payer: Medicare HMO | Attending: Cardiology

## 2022-03-01 DIAGNOSIS — R0609 Other forms of dyspnea: Secondary | ICD-10-CM | POA: Diagnosis not present

## 2022-03-01 LAB — ECHOCARDIOGRAM COMPLETE
Area-P 1/2: 4.1 cm2
MV M vel: 5.45 m/s
MV Peak grad: 118.8 mmHg
P 1/2 time: 620 msec
Radius: 0.4 cm
S' Lateral: 3.1 cm

## 2022-03-02 NOTE — Telephone Encounter (Signed)
   Primary Cardiologist: None  Chart reviewed as part of pre-operative protocol coverage. Rachel Russell was evaluated in the office by Dr. Agustin Cree on 02/07/22. She underwent cardiac testing for clearance. Due to favorable test results with no evidence of ischemia, according to AHA/ACC guidelines she may would be at acceptable risk for the planned procedure. Per Dr. Agustin Cree, she may hold her Xarelto for 48 hours prior to procedure and she should resume as soon as hemodynamically stable after procedure.   I will route this recommendation to the requesting party via Epic fax function and remove from pre-op pool.  Please call with questions.  [name], NP-C  03/02/2022, 8:55 AM [address] Office [phone] Fax [phone]

## 2022-03-08 ENCOUNTER — Telehealth: Payer: Self-pay | Admitting: *Deleted

## 2022-03-08 NOTE — Telephone Encounter (Signed)
   Patient Name: Rachel Russell  DOB: 1955-07-20 MRN: [id number]  Primary Cardiologist: None  Chart reviewed as part of pre-operative protocol coverage.  Pt was previously cleared for this procedure on 03/02/2022.  I will route this recommendation as well as the note from 03/02/2022 providing clearance to the requesting party via [facility] fax function and remove from pre-op pool.  Please call with questions.  Lenna Sciara, NP 03/08/2022, 4:54 PM

## 2022-03-08 NOTE — Telephone Encounter (Signed)
   Pre-operative Risk Assessment    Patient Name: Rachel Russell  DOB: [birth year] MRN: [id number]      Request for Surgical Clearance    Procedure:   TKA  Date of Surgery:  Clearance TBD                                 Surgeon:  DR. Lara Mulch Surgeon's Group or Practice Name:  [facility] Phone number:  [phone] Fax number:  [phone] AND [phone]   Type of Clearance Requested:   - Medical  - Pharmacy:  Hold Rivaroxaban (Xarelto) x 2 DAYS PRIOR   Type of Anesthesia:  Spinal   Additional requests/questions:    Jiles Prows   03/08/2022, 11:29 AM

## 2022-03-11 ENCOUNTER — Telehealth: Payer: Self-pay

## 2022-03-11 NOTE — Telephone Encounter (Signed)
-----   Message from Park Liter, MD sent at 03/03/2022 12:08 PM EDT ----- Echocardiogram showed preserved left ventricle ejection fraction mild pulm valve regurgitation, overall looks good

## 2022-03-11 NOTE — Telephone Encounter (Signed)
Patient notified of results and aware of clearance status

## 2022-03-17 ENCOUNTER — Ambulatory Visit: Payer: Medicare HMO

## 2022-03-17 ENCOUNTER — Other Ambulatory Visit: Payer: Self-pay

## 2022-03-17 DIAGNOSIS — N289 Disorder of kidney and ureter, unspecified: Secondary | ICD-10-CM

## 2022-03-17 MED ORDER — DAPAGLIFLOZIN PROPANEDIOL 10 MG PO TABS: 10.0000 mg | ORAL_TABLET | Freq: Every day | ORAL | 0 refills | Status: DC

## 2022-03-18 LAB — COMPREHENSIVE METABOLIC PANEL
ALT: 17 IU/L (ref 0–32)
AST: 17 IU/L (ref 0–40)
Albumin/Globulin Ratio: 1.9 (ref 1.2–2.2)
Albumin: 4.5 g/dL (ref 3.9–4.9)
Alkaline Phosphatase: 88 IU/L (ref 44–121)
BUN/Creatinine Ratio: 12 (ref 12–28)
BUN: 22 mg/dL (ref 8–27)
Bilirubin Total: 0.3 mg/dL (ref 0.0–1.2)
CO2: 19 mmol/L — ABNORMAL LOW (ref 20–29)
Calcium: 9.3 mg/dL (ref 8.7–10.3)
Chloride: 104 mmol/L (ref 96–106)
Creatinine, Ser: 1.82 mg/dL — ABNORMAL HIGH (ref 0.57–1.00)
Globulin, Total: 2.4 g/dL (ref 1.5–4.5)
Glucose: 216 mg/dL — ABNORMAL HIGH (ref 70–99)
Potassium: 3.8 mmol/L (ref 3.5–5.2)
Sodium: 142 mmol/L (ref 134–144)
Total Protein: 6.9 g/dL (ref 6.0–8.5)
eGFR: 30 mL/min/{1.73_m2} — ABNORMAL LOW (ref >59)

## 2022-03-24 ENCOUNTER — Other Ambulatory Visit: Payer: Self-pay | Admitting: Nurse Practitioner

## 2022-03-24 DIAGNOSIS — N1832 Chronic kidney disease, stage 3b: Secondary | ICD-10-CM

## 2022-04-18 ENCOUNTER — Other Ambulatory Visit: Payer: Self-pay

## 2022-04-18 MED ORDER — DAPAGLIFLOZIN PROPANEDIOL 10 MG PO TABS: 10.0000 mg | ORAL_TABLET | Freq: Every day | ORAL | 2 refills | Status: DC

## 2022-05-18 ENCOUNTER — Other Ambulatory Visit: Payer: Self-pay | Admitting: Nurse Practitioner

## 2022-05-18 ENCOUNTER — Telehealth: Payer: Self-pay

## 2022-05-18 DIAGNOSIS — N1832 Chronic kidney disease, stage 3b: Secondary | ICD-10-CM

## 2022-05-18 DIAGNOSIS — E1165 Type 2 diabetes mellitus with hyperglycemia: Secondary | ICD-10-CM

## 2022-05-18 DIAGNOSIS — N289 Disorder of kidney and ureter, unspecified: Secondary | ICD-10-CM

## 2022-05-18 MED ORDER — DAPAGLIFLOZIN PROPANEDIOL 10 MG PO TABS: 10.0000 mg | ORAL_TABLET | Freq: Every day | ORAL | 2 refills | Status: DC

## 2022-05-18 NOTE — Telephone Encounter (Signed)
Patient called requesting Rachel Russell be sent to [facility] [place].  Patient states she has not received from pharmacy.

## 2022-06-01 ENCOUNTER — Ambulatory Visit (INDEPENDENT_AMBULATORY_CARE_PROVIDER_SITE_OTHER): Payer: Medicare HMO | Admitting: Nurse Practitioner

## 2022-06-01 ENCOUNTER — Encounter: Payer: Self-pay | Admitting: Nurse Practitioner

## 2022-06-01 ENCOUNTER — Telehealth: Payer: Self-pay | Admitting: Pharmacist

## 2022-06-01 VITALS — BP 130/72 | HR 72 | Temp 97.1°F | Ht 61.0 in | Wt 201.0 lb

## 2022-06-01 DIAGNOSIS — Z87891 Personal history of nicotine dependence: Secondary | ICD-10-CM

## 2022-06-01 DIAGNOSIS — J018 Other acute sinusitis: Secondary | ICD-10-CM | POA: Diagnosis not present

## 2022-06-01 DIAGNOSIS — I10 Essential (primary) hypertension: Secondary | ICD-10-CM | POA: Diagnosis not present

## 2022-06-01 DIAGNOSIS — E782 Mixed hyperlipidemia: Secondary | ICD-10-CM | POA: Diagnosis not present

## 2022-06-01 DIAGNOSIS — K219 Gastro-esophageal reflux disease without esophagitis: Secondary | ICD-10-CM | POA: Diagnosis not present

## 2022-06-01 DIAGNOSIS — F419 Anxiety disorder, unspecified: Secondary | ICD-10-CM

## 2022-06-01 DIAGNOSIS — Z86711 Personal history of pulmonary embolism: Secondary | ICD-10-CM

## 2022-06-01 DIAGNOSIS — N1832 Chronic kidney disease, stage 3b: Secondary | ICD-10-CM | POA: Diagnosis not present

## 2022-06-01 DIAGNOSIS — R69 Illness, unspecified: Secondary | ICD-10-CM | POA: Diagnosis not present

## 2022-06-01 DIAGNOSIS — Z6837 Body mass index (BMI) 37.0-37.9, adult: Secondary | ICD-10-CM

## 2022-06-01 DIAGNOSIS — F3131 Bipolar disorder, current episode depressed, mild: Secondary | ICD-10-CM

## 2022-06-01 DIAGNOSIS — E1165 Type 2 diabetes mellitus with hyperglycemia: Secondary | ICD-10-CM | POA: Diagnosis not present

## 2022-06-01 DIAGNOSIS — E1122 Type 2 diabetes mellitus with diabetic chronic kidney disease: Secondary | ICD-10-CM | POA: Diagnosis not present

## 2022-06-01 DIAGNOSIS — Z7901 Long term (current) use of anticoagulants: Secondary | ICD-10-CM

## 2022-06-01 MED ORDER — RIVAROXABAN 15 MG PO TABS: 15.0000 mg | ORAL_TABLET | Freq: Every day | ORAL | 2 refills | Status: DC

## 2022-06-01 MED ORDER — AZITHROMYCIN 250 MG PO TABS: ORAL_TABLET | ORAL | 0 refills | Status: AC

## 2022-06-01 NOTE — Patient Instructions (Addendum)
Recommend pneumonia vaccines after recovering from upper respiratory infection Make nurse visit appt or obtain at the pharmacy We will call you with lab results Chronic care management consult ordered-expect phone call We will reschedule mammogram and DEXA scan Continue medications Take Zpack as directed for sinusitis   Sinus Infection, Adult A sinus infection is soreness and swelling (inflammation) of your sinuses. Sinuses are hollow spaces in the bones around your face. They are located: Around your eyes. In the middle of your forehead. Behind your nose. In your cheekbones. Your sinuses and nasal passages are lined with a fluid called mucus. Mucus drains out of your sinuses. Swelling can trap mucus in your sinuses. This lets germs (bacteria, virus, or fungus) grow, which leads to infection. Most of the time, this condition is caused by a virus. What are the causes? Allergies. Asthma. Germs. Things that block your nose or sinuses. Growths in the nose (nasal polyps). Chemicals or irritants in the air. A fungus. This is rare. What increases the risk? Having a weak body defense system (immune system). Doing a lot of swimming or diving. Using nasal sprays too much. Smoking. What are the signs or symptoms? The main symptoms of this condition are pain and a feeling of pressure around the sinuses. Other symptoms include: Stuffy nose (congestion). This may make it hard to breathe through your nose. Runny nose (drainage). Soreness, swelling, and warmth in the sinuses. A cough that may get worse at night. Being unable to smell and taste. Mucus that collects in the throat or the back of the nose (postnasal drip). This may cause a sore throat or bad breath. Being very tired (fatigued). A fever. How is this diagnosed? Your symptoms. Your medical history. A physical exam. Tests to find out if your condition is short-term (acute) or long-term (chronic). Your doctor may: Check your nose  for growths (polyps). Check your sinuses using a tool that has a light on one end (endoscope). Check for allergies or germs. Do imaging tests, such as an MRI or CT scan. How is this treated? Treatment for this condition depends on the cause and whether it is short-term or long-term. If caused by a virus, your symptoms should go away on their own within 10 days. You may be given medicines to relieve symptoms. They include: Medicines that shrink swollen tissue in the nose. A spray that treats swelling of the nostrils. Rinses that help get rid of thick mucus in your nose (nasal saline washes). Medicines that treat allergies (antihistamines). Over-the-counter pain relievers. If caused by bacteria, your doctor may wait to see if you will get better without treatment. You may be given antibiotic medicine if you have: A very bad infection. A weak body defense system. If caused by growths in the nose, surgery may be needed. Follow these instructions at home: Medicines Take, use, or apply over-the-counter and prescription medicines only as told by your doctor. These may include nasal sprays. If you were prescribed an antibiotic medicine, take it as told by your doctor. Do not stop taking it even if you start to feel better. Hydrate and humidify  Drink enough water to keep your pee (urine) pale yellow. Use a cool mist humidifier to keep the humidity level in your home above 50%. Breathe in steam for 10-15 minutes, 3-4 times a day, or as told by your doctor. You can do this in the bathroom while a hot shower is running. Try not to spend time in cool or dry air. Rest Rest  as much as you can. Sleep with your head raised (elevated). Make sure you get enough sleep each night. General instructions  Put a warm, moist washcloth on your face 3-4 times a day, or as often as told by your doctor. Use nasal saline washes as often as told by your doctor. Wash your hands often with soap and water. If you  cannot use soap and water, use hand sanitizer. Do not smoke. Avoid being around people who are smoking (secondhand smoke). Keep all follow-up visits. Contact a doctor if: You have a fever. Your symptoms get worse. Your symptoms do not get better within 10 days. Get help right away if: You have a very bad headache. You cannot stop vomiting. You have very bad pain or swelling around your face or eyes. You have trouble seeing. You feel confused. Your neck is stiff. You have trouble breathing. These symptoms may be an emergency. Get help right away. Call 911. Do not wait to see if the symptoms will go away. Do not drive yourself to the hospital. Summary A sinus infection is swelling of your sinuses. Sinuses are hollow spaces in the bones around your face. This condition is caused by tissues in your nose that become inflamed or swollen. This traps germs. These can lead to infection. If you were prescribed an antibiotic medicine, take it as told by your doctor. Do not stop taking it even if you start to feel better. Keep all follow-up visits. This information is not intended to replace advice given to you by your health care provider. Make sure you discuss any questions you have with your health care provider. Document Revised: 04/13/2021 Document Reviewed: 04/13/2021 Elsevier Patient Education  [place].

## 2022-06-01 NOTE — Progress Notes (Signed)
Subjective:  Patient ID: Rachel Russell, female    DOB: 03-22-1956  Age: 67 y.o. MRN: [id number]  Chief Complaint  Patient presents with   Diabetes   Hyperlipidemia   Anxiety   HPI: Pt presents for follow-up of chronic medical conditions T2DM, CKD, Hyperlipidemia, GERD, Bipolar depression, and anxiety. Vy tells me she has sinus congestion/pressure, post-nasal-drip, sore throat and cough. Onset of symptoms was a few weeks ago. Treatment has included Tylenol and OTC medication allergic rhinitis. Pt is a former smoker, quit 2017. 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  Rachel Russell has bilateral chronic knee pain. Reports she was scheduled to undergo total knee arthroplasty with Dr Ronnie Derby in 2023. States procedure was postponed due to scheduling error.    Diabetes Mellitus Type II, Follow-up  Lab Results  Component Value Date   HGBA1C 5.9 (H) 02/23/2022   Wt Readings from Last 3 Encounters:  02/24/22 202 lb (91.6 kg)  02/23/22 203 lb (92.1 kg)  02/07/22 202 lb (91.6 kg)   Last seen for diabetes 3 months ago.  Has appt with Endocrinologist in 06/2022 Management  includes Farxiga 10 mg She reports excellent compliance with treatment. She is not having side effects.  Diabetic eye exam 12/2021    Pertinent Labs: Lab Results  Component Value Date   CHOL 173 02/23/2022   HDL 50 02/23/2022   LDLCALC 95 02/23/2022   TRIG 160 (H) 02/23/2022   CHOLHDL 3.5 02/23/2022   Lab Results  Component Value Date   NA 142 03/17/2022   K 3.8 03/17/2022   CREATININE 1.82 (H) 03/17/2022   EGFR 30 (L) 03/17/2022   GLUCOSE 216 (H) 03/17/2022      Lipid/Cholesterol, Follow-up  Last lipid panel Other pertinent labs  Lab Results  Component Value Date   CHOL 173 02/23/2022   HDL 50 02/23/2022   LDLCALC 95 02/23/2022   TRIG 160 (H) 02/23/2022   CHOLHDL 3.5 02/23/2022   Lab Results  Component Value Date   ALT 17 03/17/2022   AST 17 03/17/2022   PLT 288 02/23/2022   TSH  1.070 02/23/2022     Management includes Pravastatin 80 mg. She reports excellent compliance with treatment. She is not having side effects.    Bipolar Depression, Follow-up  Current treatment includes Trazodone 150 mg QHS, Lamictal 100 mg QAM and 200 mg QPM, Paxil 40 mg QD See's psychiatry She reports excellent compliance with treatment. She is not having side effects.      02/23/2022   11:09 AM  Depression screen PHQ 2/9  Decreased Interest 3  Down, Depressed, Hopeless 3  PHQ - 2 Score 6  Altered sleeping 0  Tired, decreased energy 2  Change in appetite 3  Feeling bad or failure about yourself  3  Trouble concentrating 3  Moving slowly or fidgety/restless 3  Suicidal thoughts 0  PHQ-9 Score 20  Difficult doing work/chores Very difficult    Anxiety, Follow-up  Current treatment includes Ativan 0.5 mg BID PRN and Propranolol 60 mg QD.          She reports excellent compliance with treatment. She reports excellent tolerance of treatment. She is not having side effects.  She feels her anxiety is severe.      GAD-7 Results    02/23/2022   11:11 AM  GAD-7 Generalized Anxiety Disorder Screening Tool  1. Feeling Nervous, Anxious, or on Edge 3  2. Not Being Able to Stop or Control Worrying 3  3. Worrying Too  Much About Different Things 3  4. Trouble Relaxing 0  5. Being So Restless it's Hard To Sit Still 1  6. Becoming Easily Annoyed or Irritable 3  7. Feeling Afraid As If Something Awful Might Happen 2  Total GAD-7 Score 15  Difficulty At Work, Home, or Getting  Along With Others? Very difficult    PHQ-9 Scores    02/23/2022   11:09 AM  PHQ9 SCORE ONLY  PHQ-9 Total Score 20    GERD, Follow up:  Current treatment consist of: Pepcid 20 mg QD She reports excellent compliance with treatment. She is not having side effects. .     Current Outpatient Medications on File Prior to Visit  Medication Sig Dispense Refill   dapagliflozin propanediol (FARXIGA)  10 MG TABS tablet Take 1 tablet (10 mg total) by mouth daily before breakfast. 30 tablet 2   famotidine (PEPCID) 20 MG tablet Take 1 tablet by mouth daily.     fexofenadine (ALLEGRA) 180 MG tablet Take 180 mg by mouth daily.     lamoTRIgine (LAMICTAL) 100 MG tablet Take 300 mg by mouth daily. 1 in am and 2 in pm     LORazepam (ATIVAN) 0.5 MG tablet Take 0.5 mg by mouth 2 (two) times daily as needed for anxiety.     PARoxetine (PAXIL) 40 MG tablet Take 40 mg by mouth at bedtime.     pravastatin (PRAVACHOL) 80 MG tablet Take 80 mg by mouth daily.     propranolol ER (INDERAL LA) 60 MG 24 hr capsule Take 60 mg by mouth daily.     Rivaroxaban (XARELTO) 15 MG TABS tablet Take 15 mg by mouth daily with supper.     traZODone (DESYREL) 50 MG tablet Take 150 mg by mouth at bedtime as needed for sleep.     No current facility-administered medications on file prior to visit.   Past Medical History:  Diagnosis Date   Allergic rhinitis    Anxiety disorder    Bipolar disorder (HCC)    Chest pain 05/31/2015   Formatting of this note might be different from the original. Seen [facility] 04/12/15 with normal EKG, troponin-2, CTA chest and discharged   Dyslipidemia 02/09/2016   Essential (primary) hypertension    GERD without esophagitis    History of pulmonary embolism 02/09/2016   Hyperlipidemia    Obesity    Panic disorder (episodic paroxysmal anxiety)    Tachycardia 02/09/2016   Type 2 diabetes mellitus without complication (HCC)    Vitamin D deficiency    Past Surgical History:  Procedure Laterality Date   CHOLECYSTECTOMY  2005    Family History  Problem Relation Age of Onset   Leukemia Mother    Heart disease Mother        Pacemaker   Diabetes Father    Heart disease Father        Stents   Asthma Brother    Social History   Socioeconomic History   Marital status: Married    Spouse name: Rachel Russell   Number of children: 3   Years of education: Not on file   Highest education  level: Some college, no degree  Occupational History   Not on file  Tobacco Use   Smoking status: Former    Years: 5.00    Types: Cigarettes    Quit date: 11/2018    Years since quitting: 3.5   Smokeless tobacco: Never  Vaping Use   Vaping Use: Every day  Substance  and Sexual Activity   Alcohol use: Never   Drug use: Never   Sexual activity: Not Currently  Other Topics Concern   Not on file  Social History Narrative   Not on file   Social Determinants of Health   Financial Resource Strain: High Risk (12/27/2021)   Overall Financial Resource Strain (CARDIA)    Difficulty of Paying Living Expenses: Hard  Food Insecurity: Food Insecurity Present (12/27/2021)   Hunger Vital Sign    Worried About Running Out of Food in the Last Year: Sometimes true    Ran Out of Food in the Last Year: Sometimes true  Transportation Needs: No Transportation Needs (12/27/2021)   PRAPARE - Hydrologist (Medical): No    Lack of Transportation (Non-Medical): No  Physical Activity: Inactive (02/23/2022)   Exercise Vital Sign    Days of Exercise per Week: 0 days    Minutes of Exercise per Session: 0 min  Stress: Stress Concern Present (12/27/2021)   [place]    Feeling of Stress : Very much  Social Connections: Moderately Isolated (12/27/2021)   Social Connection and Isolation Panel [NHANES]    Frequency of Communication with Friends and Family: Three times a week    Frequency of Social Gatherings with Friends and Family: Never    Attends Religious Services: Never    Marine scientist or Organizations: No    Attends Music therapist: Not on file    Marital Status: Married    Review of Systems  Constitutional:  Positive for fatigue. Negative for chills and fever.  HENT:  Positive for congestion, postnasal drip, rhinorrhea and voice change (hoarseness). Negative for ear pain and sore throat.         Hoarseness  Respiratory:  Positive for cough. Negative for shortness of breath.   Cardiovascular:  Negative for chest pain.  Gastrointestinal:  Negative for abdominal pain, constipation, diarrhea, nausea and vomiting.  Genitourinary:  Negative for dysuria and urgency.  Musculoskeletal:  Positive for arthralgias (bilateral knees). Negative for back pain and myalgias.  Allergic/Immunologic: Positive for environmental allergies.  Neurological:  Negative for dizziness, weakness, light-headedness and headaches.  Psychiatric/Behavioral:  Negative for dysphoric mood. The patient is not nervous/anxious.      Objective:  BP 130/72   Pulse 72   Temp (!) 97.1 F (36.2 C)   Ht 5\' 1"  (1.549 m)   Wt 201 lb (91.2 kg)   SpO2 99%   BMI 37.98 kg/m       02/24/2022    8:07 AM 02/23/2022   11:05 AM 02/07/2022    1:58 PM  BP/Weight  Systolic BP  696 295  Diastolic BP  78 80  Wt. (Lbs) 202 203 202  BMI 36.95 kg/m2 37.13 kg/m2 36.95 kg/m2    Physical Exam Vitals reviewed.  Constitutional:      Appearance: She is obese.  HENT:     Right Ear: Tympanic membrane normal.     Left Ear: Tympanic membrane normal.     Nose: Congestion and rhinorrhea present.     Mouth/Throat:     Mouth: Mucous membranes are dry.     Pharynx: Posterior oropharyngeal erythema present.  Cardiovascular:     Rate and Rhythm: Normal rate and regular rhythm.     Pulses: Normal pulses.     Heart sounds: Normal heart sounds.  Pulmonary:     Effort: Pulmonary effort is normal.  Breath sounds: Normal breath sounds.  Abdominal:     General: Bowel sounds are normal.     Palpations: Abdomen is soft.  Musculoskeletal:        General: Swelling (bilateral knees) and tenderness (bilateral knees) present.  Lymphadenopathy:     Cervical: No cervical adenopathy.  Skin:    General: Skin is warm and dry.     Capillary Refill: Capillary refill takes less than 2 seconds.  Neurological:     General: No focal deficit  present.     Mental Status: She is alert and oriented to person, place, and time.  Psychiatric:        Mood and Affect: Mood normal.        Behavior: Behavior normal.         Lab Results  Component Value Date   WBC 6.1 02/23/2022   HGB 12.1 02/23/2022   HCT 36.4 02/23/2022   PLT 288 02/23/2022   GLUCOSE 216 (H) 03/17/2022   CHOL 173 02/23/2022   TRIG 160 (H) 02/23/2022   HDL 50 02/23/2022   LDLCALC 95 02/23/2022   ALT 17 03/17/2022   AST 17 03/17/2022   NA 142 03/17/2022   K 3.8 03/17/2022   CL 104 03/17/2022   CREATININE 1.82 (H) 03/17/2022   BUN 22 03/17/2022   CO2 19 (L) 03/17/2022   TSH 1.070 02/23/2022   HGBA1C 5.9 (H) 02/23/2022      Assessment & Plan:   1. Type 2 diabetes mellitus with stage 3b chronic kidney disease, without long-term current use of insulin (HCC)-well controlled - CBC with Differential/Platelet - Comprehensive metabolic panel - Hemoglobin A1c - Microalbumin / creatinine urine ratio -continue Farxiga 10 mg QD  2. Mixed hyperlipidemia-well controlled - CBC with Differential/Platelet - Comprehensive metabolic panel - Lipid panel -continue Pravastatin 80 mg QD  3. Gastroesophageal reflux disease, unspecified whether esophagitis present-well controlled - CBC with Differential/Platelet - Comprehensive metabolic panel -continue Pepcid 20 mg QD  4. Class 2 severe obesity due to excess calories with serious comorbidity and body mass index (BMI) of 37.0 to 37.9 in adult (HCC) - CBC with Differential/Platelet - Comprehensive metabolic panel - Hemoglobin A1c - Lipid panel - Microalbumin / creatinine urine ratio -heart healthy diet  5. Bipolar affective disorder, currently depressed, mild (HCC)-well controlled - CBC with Differential/Platelet - Comprehensive metabolic panel -continue Lamictal, Paxil, and Trazodone as prescribed  6. Anxiety disorder, unspecified type-well controlled - CBC with Differential/Platelet - Comprehensive  metabolic panel -continue Propranolol and Ativan as prescribed  7. Acute non-recurrent sinusitis of other sinus - CBC with Differential/Platelet - Comprehensive metabolic panel - azithromycin (ZITHROMAX) 250 MG tablet; Take 2 tablets on day 1, then 1 tablet daily on days 2 through 5  Dispense: 6 tablet; Refill: 0  8. Long term current use of anticoagulant - CBC with Differential/Platelet - Comprehensive metabolic panel -bleeding precautions  9. History of pulmonary embolism - CBC with Differential/Platelet - Comprehensive metabolic panel - Rivaroxaban (XARELTO) 15 MG TABS tablet; Take 1 tablet (15 mg total) by mouth daily with supper.  Dispense: 42 tablet; Refill: 2 - AMB Referral to Chronic Care Management Services  10. History of cigarette smoking - CBC with Differential/Platelet - Comprehensive metabolic panel   Recommend pneumonia vaccines after recovering from upper respiratory infection Make nurse visit appt or obtain at the pharmacy We will call you with lab results Chronic care management consult ordered-expect phone call We will reschedule mammogram and DEXA scan Continue medications Take Zpack as directed  for sinusitis    Follow-up: 29-months  An After Visit Summary was printed and given to the patient.  I, Janie Morning, NP, have reviewed all documentation for this visit. The documentation on 06/01/22 for the exam, diagnosis, procedures, and orders are all accurate and complete.    Signed, Janie Morning, NP [facility] [phone]

## 2022-06-02 LAB — LIPID PANEL
Chol/HDL Ratio: 5.2 ratio — ABNORMAL HIGH (ref 0.0–4.4)
Cholesterol, Total: 212 mg/dL — ABNORMAL HIGH (ref 100–199)
HDL: 41 mg/dL (ref >39)
LDL Chol Calc (NIH): 129 mg/dL — ABNORMAL HIGH (ref 0–99)
Triglycerides: 238 mg/dL — ABNORMAL HIGH (ref 0–149)
VLDL Cholesterol Cal: 42 mg/dL — ABNORMAL HIGH (ref 5–40)

## 2022-06-02 LAB — CBC WITH DIFFERENTIAL/PLATELET
Basophils Absolute: 0.1 10*3/uL (ref 0.0–0.2)
Basos: 1 %
EOS (ABSOLUTE): 0.1 10*3/uL (ref 0.0–0.4)
Eos: 2 %
Hematocrit: 39.1 % (ref 34.0–46.6)
Hemoglobin: 13 g/dL (ref 11.1–15.9)
Immature Grans (Abs): 0 10*3/uL (ref 0.0–0.1)
Immature Granulocytes: 0 %
Lymphocytes Absolute: 2.1 10*3/uL (ref 0.7–3.1)
Lymphs: 29 %
MCH: 30.1 pg (ref 26.6–33.0)
MCHC: 33.2 g/dL (ref 31.5–35.7)
MCV: 91 fL (ref 79–97)
Monocytes Absolute: 0.4 10*3/uL (ref 0.1–0.9)
Monocytes: 5 %
Neutrophils Absolute: 4.6 10*3/uL (ref 1.4–7.0)
Neutrophils: 63 %
Platelets: 345 10*3/uL (ref 150–450)
RBC: 4.32 x10E6/uL (ref 3.77–5.28)
RDW: 12 % (ref 11.7–15.4)
WBC: 7.3 10*3/uL (ref 3.4–10.8)

## 2022-06-02 LAB — COMPREHENSIVE METABOLIC PANEL
ALT: 18 IU/L (ref 0–32)
AST: 21 IU/L (ref 0–40)
Albumin/Globulin Ratio: 2 (ref 1.2–2.2)
Albumin: 4.7 g/dL (ref 3.9–4.9)
Alkaline Phosphatase: 106 IU/L (ref 44–121)
BUN/Creatinine Ratio: 10 — ABNORMAL LOW (ref 12–28)
BUN: 20 mg/dL (ref 8–27)
Bilirubin Total: 0.2 mg/dL (ref 0.0–1.2)
CO2: 22 mmol/L (ref 20–29)
Calcium: 9.6 mg/dL (ref 8.7–10.3)
Chloride: 103 mmol/L (ref 96–106)
Creatinine, Ser: 2.04 mg/dL — ABNORMAL HIGH (ref 0.57–1.00)
Globulin, Total: 2.4 g/dL (ref 1.5–4.5)
Glucose: 103 mg/dL — ABNORMAL HIGH (ref 70–99)
Potassium: 4 mmol/L (ref 3.5–5.2)
Sodium: 140 mmol/L (ref 134–144)
Total Protein: 7.1 g/dL (ref 6.0–8.5)
eGFR: 27 mL/min/{1.73_m2} — ABNORMAL LOW (ref >59)

## 2022-06-02 LAB — CARDIOVASCULAR RISK ASSESSMENT

## 2022-06-02 LAB — HEMOGLOBIN A1C
Est. average glucose Bld gHb Est-mCnc: 126 mg/dL
Hgb A1c MFr Bld: 6 % — ABNORMAL HIGH (ref 4.8–5.6)

## 2022-06-02 LAB — MICROALBUMIN / CREATININE URINE RATIO
Creatinine, Urine: 148.8 mg/dL
Microalb/Creat Ratio: 35 mg/g creat — ABNORMAL HIGH (ref 0–29)
Microalbumin, Urine: 52.8 ug/mL

## 2022-06-03 DIAGNOSIS — R69 Illness, unspecified: Secondary | ICD-10-CM | POA: Diagnosis not present

## 2022-06-03 DIAGNOSIS — F411 Generalized anxiety disorder: Secondary | ICD-10-CM | POA: Diagnosis not present

## 2022-06-03 DIAGNOSIS — F410 Panic disorder [episodic paroxysmal anxiety] without agoraphobia: Secondary | ICD-10-CM | POA: Diagnosis not present

## 2022-06-03 DIAGNOSIS — F3132 Bipolar disorder, current episode depressed, moderate: Secondary | ICD-10-CM | POA: Diagnosis not present

## 2022-06-03 NOTE — Telephone Encounter (Signed)
Appointment scheduled.

## 2022-06-08 ENCOUNTER — Other Ambulatory Visit: Payer: Self-pay

## 2022-06-08 DIAGNOSIS — Z86711 Personal history of pulmonary embolism: Secondary | ICD-10-CM

## 2022-06-08 MED ORDER — RIVAROXABAN 15 MG PO TABS: 15.0000 mg | ORAL_TABLET | Freq: Every day | ORAL | 2 refills | Status: DC

## 2022-06-10 ENCOUNTER — Other Ambulatory Visit: Payer: Medicare HMO | Admitting: Pharmacist

## 2022-06-10 NOTE — Patient Instructions (Addendum)
Hilda Blades,   I will talk to your team about starting Ozempic as Larene Beach recommended. We will mail you the applications that need to be completed for patient assistance for [place] and Ozempic.   Contact the [employer] for a reduced price of your Xarelto - [email or web address]  Catie Jodi Mourning, PharmD

## 2022-06-10 NOTE — Progress Notes (Signed)
06/10/2022 Name: Rachel Russell MRN: [id number] DOB: 06-May-1956  Chief Complaint  Patient presents with   Medication Management    Tanique Matney is a 67 y.o. year old female who presented for a telephone visit.   They were referred to the pharmacist by their PCP for assistance in managing diabetes and medication access.    Subjective:  Care Team: Primary Care Provider: Rip Harbour, NP ; Next Scheduled Visit: establishing with Neil Crouch 08/2012 Cardiologist: Agustin Cree; Next Scheduled Visit: 07/11/21 Nephrologist: Narda [facility] Carolin Sicks; Next Scheduled Visit: in February per patient report  Medication Access/Adherence  Current Pharmacy:  [id number] - [place], [place] - [address] [place] [address] Phone: [phone] Fax: [phone]   Patient reports affordability concerns with their medications: Yes  Patient reports access/transportation concerns to their pharmacy: No  Patient reports adherence concerns with their medications:  No    They fiscally support a grandchild and 2 great grandchildren   Diabetes:  Current medications: Farxiga 10 mg daily for CKD; (metformin inappropriate given renal dysfunction); note in lab result to consider Ozempic  Current meal patterns:  - Breakfast: plain biscuit with sausage; sweet tea, sometimes coffee - Lunch: leftover  - Supper: baked/BBQ chicken; pinto beans; greens; baked potato or sweet potato; salads (romaine, spinach, cheese, black olives, sunflower seeds, some pineapple; [place] or ranch) - Drinks: does like unsweet tea with lemon; sometimes soda  Current physical activity: walks dogs, limited by knee pain/osteoarthritis   Current medication access support: reports copay is >$100 per month.   Hx DVT/PE: Current medications: Xarelto 15 mg daily - patient described to me as unprovoked, but per note from Dr. Bettina Gavia in 05/2015, it happened after immobilization casting of a lower  extremity. However, also reports her grandmother had a blood clotting disordered.   Reports this copay is expensive.   Hyperlipidemia/ASCVD Risk Reduction  Current lipid lowering medications: pravastatin 80 mg daily   Depression/Anxiety:  Current medications: paroxetine 40 mg daily, lamotrigine 100 mg QAM, 200 mg QPM; lorazepam 0.5 mg twice daily; additionally on propranolol ER 60 mg daily    Objective:  Lab Results  Component Value Date   HGBA1C 6.0 (H) 06/01/2022    Lab Results  Component Value Date   CREATININE 2.04 (H) 06/01/2022   BUN 20 06/01/2022   NA 140 06/01/2022   K 4.0 06/01/2022   CL 103 06/01/2022   CO2 22 06/01/2022    Lab Results  Component Value Date   CHOL 212 (H) 06/01/2022   HDL 41 06/01/2022   LDLCALC 129 (H) 06/01/2022   TRIG 238 (H) 06/01/2022   CHOLHDL 5.2 (H) 06/01/2022    Medications Reviewed Today     Reviewed by Osker Mason, RPH-CPP (Pharmacist) on 06/10/22 at 56  Med List Status: <None>   Medication Order Taking? Sig Documenting Provider Last Dose Status Informant  dapagliflozin propanediol (FARXIGA) 10 MG TABS tablet [id number] Yes Take 1 tablet (10 mg total) by mouth daily before breakfast. Rip Harbour, NP Taking Active   famotidine (PEPCID) 20 MG tablet [id number] Yes Take 1 tablet by mouth daily. [provider] Taking Active   fexofenadine (ALLEGRA) 180 MG tablet [id number] Yes Take 180 mg by mouth daily. [provider] Taking Active   lamoTRIgine (LAMICTAL) 100 MG tablet [id number] Yes Take 300 mg by mouth daily. 1 in am and 2 in pm [provider] Taking Active   LORazepam (ATIVAN) 0.5 MG tablet [id number] Yes Take 0.5  mg by mouth 2 (two) times daily as needed for anxiety. [provider] Taking Active   PARoxetine (PAXIL) 40 MG tablet [id number] Yes Take 40 mg by mouth at bedtime. [provider] Taking Active   pravastatin (PRAVACHOL) 80 MG tablet [id number] Yes Take 80 mg  by mouth daily. [provider] Taking Active   propranolol ER (INDERAL LA) 60 MG 24 hr capsule [id number] Yes Take 60 mg by mouth daily. [provider] Taking Active   Rivaroxaban (XARELTO) 15 MG TABS tablet [id number] Yes Take 1 tablet (15 mg total) by mouth daily with supper. Rip Harbour, NP Taking Active   traZODone (DESYREL) 150 MG tablet [id number] Yes Take 150 mg by mouth at bedtime. [provider] Taking Active   Discontinued 06/10/22 1515 (Entry Error)               Assessment/Plan:   Diabetes: - Currently uncontrolled - Reviewed long term cardiovascular and renal outcomes of uncontrolled blood sugar - Reviewed dietary modifications including focus on lean proteins, fruits and vegetables, whole grains; reduction in portion sizes of carbohydrates; avoidance of sugary beverages - Reviewed lifestyle modifications including: goal to increase physical activity as tolerated after knee replacement - Discussed Ozempic. No history of pancreatitis or medullary thyroid cancer. Patient meets income criteria for Ozempic and [place]. Will collaborate with provider, CPhT, and patient to pursue assistance.   Hyperlipidemia/ASCVD Risk Reduction: - Currently uncontrolled.  - Reviewed long term complications of uncontrolled cholesterol - Reviewed dietary recommendations including: reducing high fat breakfasts like sausage gravy; encouraged lean meats - Reviewed lifestyle recommendations including: increasing physical activity as able - Recommend to change to high intensity statin. Patient reports she filled pravastatin last month, she would like to complete current supply first. Will follow up with this moving forward.   Depression/Anxiety: - Currently controlled - Recommend to continue current regimen  Hx DVT: - Unclear whether provoked or unprovoked. Continue current regimen at this time, but will discuss with cardiology whether patient could be a candidate  for reduced dose for indefinite anticoagulation - Patient meets income criteria for [employer]. Provided website and contact information; patient will outreach this program to begin enrollment.     Follow Up Plan: phone call in 4 weeks  Catie TJodi Mourning, PharmD, [place], [facility] [phone]

## 2022-06-17 ENCOUNTER — Other Ambulatory Visit: Payer: Self-pay | Admitting: Nurse Practitioner

## 2022-06-17 DIAGNOSIS — E1122 Type 2 diabetes mellitus with diabetic chronic kidney disease: Secondary | ICD-10-CM

## 2022-06-17 MED ORDER — OZEMPIC (0.25 OR 0.5 MG/DOSE) 2 MG/3ML ~~LOC~~ SOPN: 0.2500 mg | PEN_INJECTOR | SUBCUTANEOUS | 1 refills | Status: DC

## 2022-06-17 MED ORDER — ONDANSETRON HCL 4 MG PO TABS: 4.0000 mg | ORAL_TABLET | Freq: Three times a day (TID) | ORAL | 0 refills | Status: DC | PRN

## 2022-06-17 NOTE — Telephone Encounter (Signed)
Prescription already sent

## 2022-06-20 ENCOUNTER — Other Ambulatory Visit (HOSPITAL_COMMUNITY): Payer: Self-pay

## 2022-06-20 ENCOUNTER — Other Ambulatory Visit: Payer: Self-pay

## 2022-06-20 DIAGNOSIS — E1122 Type 2 diabetes mellitus with diabetic chronic kidney disease: Secondary | ICD-10-CM

## 2022-06-20 MED ORDER — ONDANSETRON HCL 4 MG PO TABS: 4.0000 mg | ORAL_TABLET | Freq: Three times a day (TID) | ORAL | 0 refills | Status: DC | PRN

## 2022-06-21 ENCOUNTER — Telehealth: Payer: Self-pay

## 2022-06-21 NOTE — Telephone Encounter (Signed)
Mailed PAP application FOR FARXIGA(AZ&ME) AND OZEMPIC(NOVO NORDISK) to patient home. Patient may fax back or bring to office. I will fax provider portion when I receive pt portion.   Sandre [facility] Patient Advocate

## 2022-06-29 NOTE — Progress Notes (Signed)
Addressed. Dr. Tobie Poet

## 2022-07-11 ENCOUNTER — Encounter: Payer: Self-pay | Admitting: Cardiology

## 2022-07-11 ENCOUNTER — Ambulatory Visit: Payer: Medicare HMO | Attending: Cardiology | Admitting: Cardiology

## 2022-07-11 VITALS — BP 96/80 | HR 72 | Ht 61.0 in | Wt 199.0 lb

## 2022-07-11 DIAGNOSIS — I10 Essential (primary) hypertension: Secondary | ICD-10-CM

## 2022-07-11 DIAGNOSIS — E785 Hyperlipidemia, unspecified: Secondary | ICD-10-CM

## 2022-07-11 DIAGNOSIS — Z86711 Personal history of pulmonary embolism: Secondary | ICD-10-CM | POA: Diagnosis not present

## 2022-07-11 DIAGNOSIS — E782 Mixed hyperlipidemia: Secondary | ICD-10-CM

## 2022-07-11 MED ORDER — ATORVASTATIN CALCIUM 20 MG PO TABS: 20.0000 mg | ORAL_TABLET | Freq: Every day | ORAL | 3 refills | Status: DC

## 2022-07-11 NOTE — Addendum Note (Signed)
Addended by: Jacobo Forest D on: 07/11/2022 01:43 PM   Modules accepted: Orders

## 2022-07-11 NOTE — Progress Notes (Signed)
Cardiology Office Note:    Date:  07/11/2022   ID:  Rachel Russell, DOB 1956-05-05, MRN [id number]  PCP:  Rip Harbour, NP (Inactive)  Cardiologist:  Jenne Campus, MD    Referring MD: Rhea Bleacher, NP   No chief complaint on file.   History of Present Illness:    Rachel Russell is a 67 y.o. female with past medical history significant for pulmonary emboli many years ago, she is anticoagulated, essential hypertension, dyslipidemia, diabetes.  She was referred to [place] for evaluation before elective knee surgery.  That evaluation included echocardiogram which showed preserved left ventricular ejection fraction as well as stress test which showed no evidence of ischemia.  She is coming today to months for follow-up overall she is doing well.  Denies have any chest pain tightness squeezing pressure burning chest, obesity she cannot exercise because of knee problem  Past Medical History:  Diagnosis Date   Allergic rhinitis    Anxiety disorder    Bipolar disorder ([place])    Chest pain 05/31/2015   Formatting of this note might be different from the original. Seen at [facility] ED 04/12/15 with normal EKG, troponin-2, CTA chest and discharged   Dyslipidemia 02/09/2016   Essential (primary) hypertension    GERD without esophagitis    History of pulmonary embolism 02/09/2016   Hyperlipidemia    Obesity    Panic disorder (episodic paroxysmal anxiety)    Tachycardia 02/09/2016   Type 2 diabetes mellitus without complication ([place])    Vitamin D deficiency     Past Surgical History:  Procedure Laterality Date   CHOLECYSTECTOMY  2005    Current Medications: Current Meds  Medication Sig   dapagliflozin propanediol (FARXIGA) 10 MG TABS tablet Take 1 tablet (10 mg total) by mouth daily before breakfast.   famotidine (PEPCID) 20 MG tablet Take 1 tablet by mouth daily.   fexofenadine (ALLEGRA) 180 MG tablet Take 180 mg by mouth daily.   lamoTRIgine (LAMICTAL) 100 MG tablet Take 300 mg by  mouth daily. 1 in am and 2 in pm   LORazepam (ATIVAN) 0.5 MG tablet Take 0.5 mg by mouth 2 (two) times daily as needed for anxiety.   PARoxetine (PAXIL) 40 MG tablet Take 40 mg by mouth at bedtime.   pravastatin (PRAVACHOL) 80 MG tablet Take 80 mg by mouth daily.   propranolol ER (INDERAL LA) 60 MG 24 hr capsule Take 60 mg by mouth daily.   Rivaroxaban (XARELTO) 15 MG TABS tablet Take 1 tablet (15 mg total) by mouth daily with supper.   Semaglutide,0.25 or 0.5MG/DOS, (OZEMPIC, 0.25 OR 0.5 MG/DOSE,) 2 MG/3ML SOPN Inject 0.25 mg into the skin once a week.   traZODone (DESYREL) 150 MG tablet Take 150 mg by mouth at bedtime.     Allergies:   Iodinated contrast media, Shellfish-derived products, Codeine, Latex, and Penicillins   Social History   Socioeconomic History   Marital status: Married    Spouse name: Brooklen Thomann   Number of children: 3   Years of education: Not on file   Highest education level: Some college, no degree  Occupational History   Not on file  Tobacco Use   Smoking status: Former    Years: 5.00    Types: Cigarettes    Quit date: 11/2018    Years since quitting: 3.6   Smokeless tobacco: Never  Vaping Use   Vaping Use: Every day  Substance and Sexual Activity   Alcohol use: Never   Drug use:  Never   Sexual activity: Not Currently  Other Topics Concern   Not on file  Social History Narrative   Not on file   Social Determinants of Health   Financial Resource Strain: High Risk (12/27/2021)   Overall Financial Resource Strain (CARDIA)    Difficulty of Paying Living Expenses: Hard  Food Insecurity: Food Insecurity Present (12/27/2021)   Hunger Vital Sign    Worried About Running Out of Food in the Last Year: Sometimes true    Ran Out of Food in the Last Year: Sometimes true  Transportation Needs: No Transportation Needs (12/27/2021)   PRAPARE - Hydrologist (Medical): No    Lack of Transportation (Non-Medical): No  Physical  Activity: Inactive (02/23/2022)   Exercise Vital Sign    Days of Exercise per Week: 0 days    Minutes of Exercise per Session: 0 min  Stress: Stress Concern Present (12/27/2021)   [place]    Feeling of Stress : Very much  Social Connections: Moderately Isolated (12/27/2021)   Social Connection and Isolation Panel [NHANES]    Frequency of Communication with Friends and Family: Three times a week    Frequency of Social Gatherings with Friends and Family: Never    Attends Religious Services: Never    Marine scientist or Organizations: No    Attends Music therapist: Not on file    Marital Status: Married     Family History: The patient's family history includes Asthma in her brother; Diabetes in her father; Heart disease in her father and mother; Leukemia in her mother. ROS:   Please see the history of present illness.    All 14 point review of systems negative except as described per history of present illness  EKGs/Labs/Other Studies Reviewed:      Recent Labs: 02/23/2022: TSH 1.070 06/01/2022: ALT 18; BUN 20; Creatinine, Ser 2.04; Hemoglobin 13.0; Platelets 345; Potassium 4.0; Sodium 140  Recent Lipid Panel    Component Value Date/Time   CHOL 212 (H) 06/01/2022 1219   TRIG 238 (H) 06/01/2022 1219   HDL 41 06/01/2022 1219   CHOLHDL 5.2 (H) 06/01/2022 1219   LDLCALC 129 (H) 06/01/2022 1219    Physical Exam:    VS:  BP 96/80 (BP Location: Left Arm, Patient Position: Sitting)   Pulse 72   Ht 5' 1"$  (1.549 m)   Wt 199 lb (90.3 kg)   SpO2 96%   BMI 37.60 kg/m     Wt Readings from Last 3 Encounters:  07/11/22 199 lb (90.3 kg)  06/01/22 201 lb (91.2 kg)  02/24/22 202 lb (91.6 kg)     GEN:  Well nourished, well developed in no acute distress HEENT: Normal NECK: No JVD; No carotid bruits LYMPHATICS: No lymphadenopathy CARDIAC: RRR, no murmurs, no rubs, no gallops RESPIRATORY:  Clear to  auscultation without rales, wheezing or rhonchi  ABDOMEN: Soft, non-tender, non-distended MUSCULOSKELETAL:  No edema; No deformity  SKIN: Warm and dry LOWER EXTREMITIES: no swelling NEUROLOGIC:  Alert and oriented x 3 PSYCHIATRIC:  Normal affect   ASSESSMENT:    1. Essential (primary) hypertension   2. Dyslipidemia   3. History of pulmonary embolism   4. Mixed hyperlipidemia    PLAN:    In order of problems listed above:  Essential hypertension, blood pressure seems to be well-controlled actually on the lower side continue present management. Dyslipidemia I did review her K PN which show  me LDL of 129 HDL 41 this is from January of this year, she is on pravastatin 80 which obviously is not doing a good job.  I recommend to switch her from pravastatin to Lipitor 20 however asked her to start doing that switch couple weeks after knee replacement surgery which she will have at the end of the month. History of pulmonary emboli.  Will be able to hold her Xarelto for 24 to 48 hours before surgery. Cardiovascular evaluation before surgery stress test negative echocardiogram normal ejection fraction can proceed with surgery as scheduled.   Medication Adjustments/Labs and Tests Ordered: Current medicines are reviewed at length with the patient today.  Concerns regarding medicines are outlined above.  No orders of the defined types were placed in this encounter.  Medication changes: No orders of the defined types were placed in this encounter.   Signed, Park Liter, MD, [facility] 07/11/2022 1:28 PM    [place]

## 2022-07-11 NOTE — Addendum Note (Signed)
Addended by: Jacobo Forest D on: 07/11/2022 01:38 PM   Modules accepted: Orders

## 2022-07-11 NOTE — Patient Instructions (Addendum)
Medication Instructions:   START: Lipitor 43m 1 tablet daily- start in place of Pravastatin 1 month after surgery THEN in 6 weeks come in for blood work   Lab Work: Your physician recommends that you return for lab work in: 6 weeks after [place] to [place] to have labs done when you are fasting.  You can come Monday through Friday 8:30 am to 12:00 pm and 1:15 to 4:30. You do not need to make an appointment as the order has already been placed. The labs you are going to have done are AST, ALT  Lipids.    Testing/Procedures: None Ordered   Follow-Up: [facility] and your health needs are our priority.  As part of our continuing mission to provide you with exceptional heart care, we have created designated Provider Care Teams.  These Care Teams include your primary Cardiologist (physician) and Advanced Practice Providers (APPs -  Physician Assistants and Nurse Practitioners) who all work together to provide you with the care you need, when you need it.  We recommend signing up for the patient portal called "MyChart".  Sign up information is provided on this After Visit Summary.  MyChart is used to connect with patients for Virtual Visits (Telemedicine).  Patients are able to view lab/test results, encounter notes, upcoming appointments, etc.  Non-urgent messages can be sent to your provider as well.   To learn more about what you can do with MyChart, go to [email or web address]    Your next appointment:   12 month(s)  The format for your next appointment:   In Person  Provider:   RJenne Campus MD    Other Instructions NA

## 2022-07-14 ENCOUNTER — Ambulatory Visit (INDEPENDENT_AMBULATORY_CARE_PROVIDER_SITE_OTHER): Payer: Medicare HMO

## 2022-07-14 DIAGNOSIS — Z0000 Encounter for general adult medical examination without abnormal findings: Secondary | ICD-10-CM | POA: Diagnosis not present

## 2022-07-14 DIAGNOSIS — Z5941 Food insecurity: Secondary | ICD-10-CM | POA: Diagnosis not present

## 2022-07-14 DIAGNOSIS — Z1211 Encounter for screening for malignant neoplasm of colon: Secondary | ICD-10-CM

## 2022-07-14 NOTE — Progress Notes (Signed)
Subjective:   Merril Holk is a 67 y.o. female who presents for Medicare Annual (Subsequent) preventive examination.  I connected with  Dorris Fetch on 07/14/22 by a audio enabled telemedicine application and verified that I am speaking with the correct person using two identifiers.  Patient Location: Home  Provider Location: Office/Clinic  I discussed the limitations of evaluation and management by telemedicine. The patient expressed understanding and agreed to proceed.  Cardiac Risk Factors include: advanced age (>105mn, >>84women);diabetes mellitus;obesity (BMI >30kg/m2)     Objective:    Today's Vitals   07/10/22 1437  PainSc: 3    There is no height or weight on file to calculate BMI.    Current Medications (verified) Outpatient Encounter Medications as of 07/14/2022  Medication Sig   atorvastatin (LIPITOR) 20 MG tablet Take 1 tablet (20 mg total) by mouth daily.   dapagliflozin propanediol (FARXIGA) 10 MG TABS tablet Take 1 tablet (10 mg total) by mouth daily before breakfast.   famotidine (PEPCID) 20 MG tablet Take 1 tablet by mouth daily.   fexofenadine (ALLEGRA) 180 MG tablet Take 180 mg by mouth daily.   lamoTRIgine (LAMICTAL) 100 MG tablet Take 300 mg by mouth daily. 1 in am and 2 in pm   LORazepam (ATIVAN) 0.5 MG tablet Take 0.5 mg by mouth 2 (two) times daily as needed for anxiety.   PARoxetine (PAXIL) 40 MG tablet Take 40 mg by mouth at bedtime.   propranolol ER (INDERAL LA) 60 MG 24 hr capsule Take 60 mg by mouth daily.   Rivaroxaban (XARELTO) 15 MG TABS tablet Take 1 tablet (15 mg total) by mouth daily with supper.   Semaglutide,0.25 or 0.'5MG'$ /DOS, (OZEMPIC, 0.25 OR 0.5 MG/DOSE,) 2 MG/3ML SOPN Inject 0.25 mg into the skin once a week.   traZODone (DESYREL) 150 MG tablet Take 150 mg by mouth at bedtime.   No facility-administered encounter medications on file as of 07/14/2022.    Allergies (verified) Iodinated contrast media, Shellfish-derived products,  Codeine, Latex, and Penicillins   History: Past Medical History:  Diagnosis Date   Allergic rhinitis    Anxiety disorder    Bipolar disorder ([place]    Chest pain 05/31/2015   Formatting of this note might be different from the original. [facility], troponin-2, CTA chest and discharged   Dyslipidemia 02/09/2016   Essential (primary) hypertension    GERD without esophagitis    History of pulmonary embolism 02/09/2016   Hyperlipidemia    Obesity    Panic disorder (episodic paroxysmal anxiety)    Tachycardia 02/09/2016   [place] 2 diabetes [place] complication ([place]    Vitamin D deficiency    Past Surgical History:  Procedure Laterality Date   CHOLECYSTECTOMY  2005   HERNIA REPAIR  2005   x2   Family History  Problem Relation Age of Onset   Leukemia Mother    Heart disease Mother        Pacemaker   Diabetes Father    Heart disease Father        Stents   Asthma Brother    Social History   Socioeconomic History   Marital status: Married    Spouse name: DArelene Cantor  Number of children: 3   Years of education: Not on file   Highest education level: Some college, no degree  Occupational History   Not on file  Tobacco Use   Smoking status: Former    Years: 5.00  Types: Cigarettes    Quit date: 11/2018    Years since quitting: 3.6   Smokeless tobacco: Never  Vaping Use   Vaping Use: Every day  Substance and Sexual Activity   Alcohol use: Never   Drug use: Never   Sexual activity: Not Currently  Other Topics Concern   Not on file  Social History Narrative   Not on file   Social Determinants of Health   Financial Resource Strain: High Risk (07/10/2022)   Overall Financial Resource Strain (CARDIA)    Difficulty of Paying Living Expenses: Very hard  Food Insecurity: Food Insecurity Present (07/10/2022)   Hunger Vital Sign    Worried About Running Out of Food in the Last Year: Often true    Ran Out of Food in the Last  Year: Often true  Transportation Needs: No Transportation Needs (07/10/2022)   PRAPARE - Hydrologist (Medical): No    Lack of Transportation (Non-Medical): No  Physical Activity: Insufficiently Active (07/10/2022)   Exercise Vital Sign    Days of Exercise per Week: 2 days    Minutes of Exercise per Session: 20 min  Stress: Stress Concern Present (07/10/2022)   [place]    Feeling of Stress : Rather much  Social Connections: Socially Isolated (07/10/2022)   Social Connection and Isolation Panel [NHANES]    Frequency of Communication with Friends and Family: Never    Frequency of Social Gatherings with Friends and Family: Never    Attends Religious Services: Never    Marine scientist or Organizations: No    Attends Music therapist: Never    Marital Status: Married    Tobacco Counseling Counseling given: Not Answered   Clinical Intake:  Pre-visit preparation completed: Yes Pain : 0-10 Pain Score: 3  Pain Location: Knee Pain Orientation: Right Pain Frequency: Intermittent   BMI - recorded: 37.6 Nutritional Status: BMI > 30  Obese Nutritional Risks: None Diabetes: Yes (most recent A1C 6.0) CBG done?: No How often do you need to have someone help you when you read instructions, pamphlets, or other written materials from your doctor or pharmacy?: 3 - Sometimes Interpreter Needed?: No    Activities of Daily Living    07/10/2022    2:37 PM 02/23/2022   11:07 AM  In your present state of health, do you have any difficulty performing the following activities:  Hearing? 0 0  Vision? 0 0  Difficulty concentrating or making decisions? 1 0  Walking or climbing stairs? 1 1  Comment  knee pain  Dressing or bathing? 1 0  Doing errands, shopping? 0 0  Preparing Food and eating ? N   Using the Toilet? N   In the past six months, have you accidently leaked urine? Y    Do you have problems with loss of bowel control? N   Managing your Medications? N   Managing your Finances? Y   Housekeeping or managing your Housekeeping? Y     Patient Care Team: Rip Harbour, NP (Inactive) as PCP - General (Nurse Practitioner) [place], [facility] Vickey Huger, MD as Consulting Physician (Orthopedic Surgery) Rosita Fire, MD as Consulting Physician (Nephrology)     Assessment:   This is a routine wellness examination for Iza.  Hearing/Vision screen No results found.  Dietary issues and exercise activities discussed: Current Exercise Habits: The patient does not participate in regular exercise at present, Exercise limited  by: orthopedic condition(s)   Depression Screen    07/14/2022    3:06 PM 02/23/2022   11:09 AM  PHQ 2/9 Scores  PHQ - 2 Score 6 6  PHQ- 9 Score 15 20    Fall Risk    07/10/2022    2:37 PM 02/23/2022   11:06 AM  Fall Risk   Falls in the past year? 0 0  Number falls in past yr: 1 0  Injury with Fall? 0 0  Risk for fall due to : Impaired balance/gait Impaired balance/gait;Other (Comment)  Risk for fall due to: Comment  Right knee pain  Follow up Falls evaluation completed;Education provided Falls evaluation completed    [place]:  Home free of loose throw rugs in walkways, pet beds, electrical cords, etc? Yes  Adequate lighting in your home to reduce risk of falls? Yes    Cognitive Function:        07/14/2022    3:30 PM  6CIT Screen  What Year? 0 points  What month? 0 points  What time? 0 points  Count back from 20 0 points  Months in reverse 0 points  Repeat phrase 0 points  Total Score 0 points    Immunizations Immunization History  Administered Date(s) Administered   Influenza-Unspecified 02/07/2022   Moderna Sars-Covid-2 Vaccination 09/11/2019, 10/09/2019    TDAP status: Due, Education has been provided regarding the importance of this vaccine.  Advised may receive this vaccine at local pharmacy or Health Dept. Aware to provide a copy of the vaccination record if obtained from local pharmacy or Health Dept. Verbalized acceptance and understanding.  Flu Vaccine status: Up to date  Pneumococcal vaccine status: Due, Education has been provided regarding the importance of this vaccine. Advised may receive this vaccine at local pharmacy or Health Dept. Aware to provide a copy of the vaccination record if obtained from local pharmacy or Health Dept. Verbalized acceptance and understanding.  Covid-19 vaccine status: Declined, Education has been provided regarding the importance of this vaccine but patient still declined. Advised may receive this vaccine at local pharmacy or Health Dept.or vaccine clinic. Aware to provide a copy of the vaccination record if obtained from local pharmacy or Health Dept. Verbalized acceptance and understanding.  Qualifies for Shingles Vaccine? Yes   Zostavax completed No   Shingrix Completed?: No.    Education has been provided regarding the importance of this vaccine. Patient has been advised to call insurance company to determine out of pocket expense if they have not yet received this vaccine. Advised may also receive vaccine at local pharmacy or Health Dept. Verbalized acceptance and understanding.  Screening Tests Health Maintenance  Topic Date Due   Medicare Annual Wellness (AWV)  Never done   DTaP/Tdap/Td (1 - Tdap) Never done   MAMMOGRAM  Never done   Zoster Vaccines- Shingrix (1 of 2) Never done   Pneumonia Vaccine 36+ Years old (1 of 1 - PCV) Never done   DEXA SCAN  Never done   COLONOSCOPY (Pts 45-47yr Insurance coverage will need to be confirmed)  02/24/2023 (Originally 06/06/2001)   HEMOGLOBIN A1C  11/30/2022   OPHTHALMOLOGY EXAM  01/08/2023   Diabetic kidney evaluation - eGFR measurement  06/02/2023   Diabetic kidney evaluation - Urine ACR  06/02/2023   FOOT EXAM  06/02/2023   INFLUENZA VACCINE   Completed   HPV VACCINES  Aged Out   COVID-19 Vaccine  Discontinued    Health Maintenance  Health Maintenance  Due  Topic Date Due   Medicare Annual Wellness (AWV)  Never done   DTaP/Tdap/Td (1 - Tdap) Never done   MAMMOGRAM  Never done   Zoster Vaccines- Shingrix (1 of 2) Never done   Pneumonia Vaccine 62+ Years old (1 of 1 - PCV) Never done   DEXA SCAN  Never done    Mammogram status: Ordered   Bone Density status: Ordered   Lung Cancer Screening: (Low Dose CT Chest recommended if Age 94-80 years, 30 pack-year currently smoking OR have quit w/in 15years.) does not qualify.   Lung Cancer Screening Referral: N/A  Additional Screening:  Vision Screening: Recommended annual ophthalmology exams for early detection of glaucoma and other disorders of the eye. Is the patient up to date with their annual eye exam?  Yes   Dental Screening: Recommended annual dental exams for proper oral hygiene  Community Resource Referral / Chronic Care Management: CRR required this visit?  No   CCM required this visit?  No      Plan:    1- [id number] for SDOH needs 2- Patient will schedule Mammogram and DEXA after knee replacement in March 3- Patient states that she did a Cologuard a couple of years ago - no records found in Cologuard.  Ordered  4- Tetanus & Shingles Vaccine due - can get at pharmacy 5- Patient states that PNA vaccine was given before - checked the [facility] and didn't find any documentation of it  I have personally reviewed and noted the following in the patient's chart:   Medical and social history Use of alcohol, tobacco or illicit drugs  Current medications and supplements including opioid prescriptions.  Functional ability and status Nutritional status Physical activity Advanced directives List of other physicians Hospitalizations, surgeries, and ER visits in previous 12 months Vitals Screenings to include cognitive, depression, and falls Referrals and  appointments  In addition, I have reviewed and discussed with patient certain preventive protocols, quality metrics, and best practice recommendations. A written personalized care plan for preventive services as well as general preventive health recommendations were provided to patient.     Erie Noe, LPN   [id number]

## 2022-07-14 NOTE — Patient Instructions (Signed)
Rachel Russell , Thank you for taking time to come for your Medicare Wellness Visit. I appreciate your ongoing commitment to your health goals. Please review the following plan we discussed and let me know if I can assist you in the future.   Screening recommendations/referrals: Colonoscopy: Cologuard ordered Mammogram: Schedule after knee replacement Bone Density: Schedule after knee replacement Recommended yearly ophthalmology/optometry visit for glaucoma screening and checkup Recommended yearly dental visit for hygiene and checkup  Vaccinations: Influenza vaccine: up-to-date Pneumococcal vaccine: Don't have documentation of previous vaccines given Tdap vaccine: Due - you can get this at the pharmacy Shingles vaccine: Due - you can get this at the pharmacy     Preventive Care 65 Years and Older, Female   Preventive care refers to lifestyle choices and visits with your health care provider that can promote health and wellness.  What does preventive care include? A yearly physical exam. This is also called an annual well check. Dental exams once or twice a year. Routine eye exams. Ask your health care provider how often you should have your eyes checked. Personal lifestyle choices, including: Daily care of your teeth and gums. Regular physical activity. Eating a healthy diet. Avoiding tobacco and drug use. Limiting alcohol use. Practicing safe sex. Taking low-dose aspirin every day. Taking vitamin and mineral supplements as recommended by your health care provider.  What happens during an annual well check? The services and screenings done by your health care provider during your annual well check will depend on your age, overall health, lifestyle risk factors, and family history of disease.  Counseling Your health care provider may ask you questions about your: Alcohol use. Tobacco use. Drug use. Emotional well-being. Home and relationship well-being. Sexual  activity. Eating habits. History of falls. Memory and ability to understand (cognition). Work and work Statistician. Reproductive health.  Screening You may have the following tests or measurements: Height, weight, and BMI. Blood pressure. Lipid and cholesterol levels. These may be checked every 5 years, or more frequently if you are over 47 years old. Skin check. Lung cancer screening. You may have this screening every year starting at age 73 if you have a 30-pack-year history of smoking and currently smoke or have quit within the past 15 years. Fecal occult blood test (FOBT) of the stool. You may have this test every year starting at age 31. Flexible sigmoidoscopy or colonoscopy. You may have a sigmoidoscopy every 5 years or a colonoscopy every 10 years starting at age 42. Hepatitis C blood test. Hepatitis B blood test. Sexually transmitted disease (STD) testing. Diabetes screening. This is done by checking your blood sugar (glucose) after you have not eaten for a while (fasting). You may have this done every 1-3 years. Bone density scan. This is done to screen for osteoporosis. You may have this done starting at age 68. Mammogram. This may be done every 1-2 years. Talk to your health care provider about how often you should have regular mammograms. Talk with your health care provider about your test results, treatment options, and if necessary, the need for more tests.  Vaccines Your health care provider may recommend certain vaccines, such as: Influenza vaccine. This is recommended every year. Tetanus, diphtheria, and acellular pertussis (Tdap, Td) vaccine. You may need a Td booster every 10 years. Zoster vaccine. You may need this after age 23. Pneumococcal 13-valent conjugate (PCV13) vaccine. One dose is recommended after age 27. Pneumococcal polysaccharide (PPSV23) vaccine. One dose is recommended after age 57. Talk to  your health care provider about which screenings and vaccines  you need and how often you need them.  This information is not intended to replace advice given to you by your health care provider. Make sure you discuss any questions you have with your health care provider. Document Released: 06/05/2015 Document Revised: 01/27/2016 Document Reviewed: 03/10/2015 Elsevier Interactive Patient Education  2017 [place] Prevention in the Home  Falls can cause injuries. They can happen to people of all ages. There are many things you can do to make your home safe and to help prevent falls.  What can I do on the outside of my home? Regularly fix the edges of walkways and driveways and fix any cracks. Remove anything that might make you trip as you walk through a door, such as a raised step or threshold. Trim any bushes or trees on the path to your home. Use bright outdoor lighting. Clear any walking paths of anything that might make someone trip, such as rocks or tools. Regularly check to see if handrails are loose or broken. Make sure that both sides of any steps have handrails. Any raised decks and porches should have guardrails on the edges. Have any leaves, snow, or ice cleared regularly. Use sand or salt on walking paths during winter. Clean up any spills in your garage right away. This includes oil or grease spills.  What can I do in the bathroom? Use night lights. Install grab bars by the toilet and in the tub and shower. Do not use towel bars as grab bars. Use non-skid mats or decals in the tub or shower. If you need to sit down in the shower, use a plastic, non-slip stool. Keep the floor dry. Clean up any water that spills on the floor as soon as it happens. Remove soap buildup in the tub or shower regularly. Attach bath mats securely with double-sided non-slip rug tape. Do not have throw rugs and other things on the floor that can make you trip.  What can I do in the bedroom? Use night lights. Make sure that you have a light by  your bed that is easy to reach. Do not use any sheets or blankets that are too big for your bed. They should not hang down onto the floor. Have a firm chair that has side arms. You can use this for support while you get dressed. Do not have throw rugs and other things on the floor that can make you trip.  What can I do in the kitchen? Clean up any spills right away. Avoid walking on wet floors. Keep items that you use a lot in easy-to-reach places. If you need to reach something above you, use a strong step stool that has a grab bar. Keep electrical cords out of the way. Do not use floor polish or wax that makes floors slippery. If you must use wax, use non-skid floor wax. Do not have throw rugs and other things on the floor that can make you trip.  What can I do with my stairs? Do not leave any items on the stairs. Make sure that there are handrails on both sides of the stairs and use them. Fix handrails that are broken or loose. Make sure that handrails are as long as the stairways. Check any carpeting to make sure that it is firmly attached to the stairs. Fix any carpet that is loose or worn. Avoid having throw rugs at the top or bottom of the  stairs. If you do have throw rugs, attach them to the floor with carpet tape. Make sure that you have a light switch at the top of the stairs and the bottom of the stairs. If you do not have them, ask someone to add them for you.  What else can I do to help prevent falls? Wear shoes that: Do not have high heels. Have rubber bottoms. Are comfortable and fit you well. Are closed at the toe. Do not wear sandals. If you use a stepladder: Make sure that it is fully opened. Do not climb a closed stepladder. Make sure that both sides of the stepladder are locked into place. Ask someone to hold it for you, if possible. Clearly mark and make sure that you can see: Any grab bars or handrails. First and last steps. Where the edge of each step is. Use  tools that help you move around (mobility aids) if they are needed. These include: Canes. Walkers. Scooters. Crutches. Turn on the lights when you go into a dark area. Replace any light bulbs as soon as they burn out. Set up your furniture so you have a clear path. Avoid moving your furniture around. If any of your floors are uneven, fix them. If there are any pets around you, be aware of where they are. Review your medicines with your doctor. Some medicines can make you feel dizzy. This can increase your chance of falling. Ask your doctor what other things that you can do to help prevent falls.  This information is not intended to replace advice given to you by your health care provider. Make sure you discuss any questions you have with your health care provider. Document Released: 03/05/2009 Document Revised: 10/15/2015 Document Reviewed: 06/13/2014 Elsevier Interactive Patient Education  2017 [employer].

## 2022-07-15 ENCOUNTER — Telehealth: Payer: Self-pay

## 2022-07-15 DIAGNOSIS — R69 Illness, unspecified: Secondary | ICD-10-CM | POA: Diagnosis not present

## 2022-07-15 DIAGNOSIS — F411 Generalized anxiety disorder: Secondary | ICD-10-CM | POA: Diagnosis not present

## 2022-07-15 DIAGNOSIS — F410 Panic disorder [episodic paroxysmal anxiety] without agoraphobia: Secondary | ICD-10-CM | POA: Diagnosis not present

## 2022-07-15 DIAGNOSIS — F3132 Bipolar disorder, current episode depressed, moderate: Secondary | ICD-10-CM | POA: Diagnosis not present

## 2022-07-15 NOTE — Telephone Encounter (Signed)
   Telephone encounter was:  Unsuccessful.  07/15/2022 Name: Rachel Russell MRN: [id number] DOB: 1955/09/14  Unsuccessful outbound call made today to assist with:  Food Insecurity and Financial Difficulties related to financial strain  Outreach Attempt:  1st Attempt  A HIPAA compliant voice message was left requesting a return call.  Instructed patient to call back .    [facility] [phone] [address], [place], [place] [address] Phone: [phone] Email: [email or web address]

## 2022-07-20 ENCOUNTER — Telehealth: Payer: Self-pay

## 2022-07-20 DIAGNOSIS — M1711 Unilateral primary osteoarthritis, right knee: Secondary | ICD-10-CM | POA: Diagnosis not present

## 2022-07-20 DIAGNOSIS — G8918 Other acute postprocedural pain: Secondary | ICD-10-CM | POA: Diagnosis not present

## 2022-07-20 DIAGNOSIS — Z96651 Presence of right artificial knee joint: Secondary | ICD-10-CM | POA: Diagnosis not present

## 2022-07-20 NOTE — Telephone Encounter (Signed)
   Telephone encounter was:  Unsuccessful.  07/20/2022 Name: Delanie Wagg MRN: [id number] DOB: 1956/05/02  Unsuccessful outbound call made today to assist with:  Food Insecurity  Outreach Attempt:  2nd Attempt  A HIPAA compliant voice message was left requesting a return call.  Instructed patient to call back .    [facility] [phone] [address], [place], [place] [address] Phone: [phone] Email: [email or web address]

## 2022-07-24 ENCOUNTER — Other Ambulatory Visit: Payer: Self-pay | Admitting: Family Medicine

## 2022-07-25 ENCOUNTER — Telehealth: Payer: Self-pay

## 2022-07-25 NOTE — Telephone Encounter (Signed)
   Telephone encounter was:  Unsuccessful.  07/25/2022 Name: Rachel Russell MRN: [id number] DOB: 1955/10/23  Unsuccessful outbound call made today to assist with:  Food Insecurity  Outreach Attempt:  3rd Attempt.  Referral closed unable to contact patient.  A HIPAA compliant voice message was left requesting a return call.  Instructed patient to call back    [facility] [phone] [address], [place], [place] [address] Phone: [phone] Email: [email or web address]

## 2022-07-28 DIAGNOSIS — Z471 Aftercare following joint replacement surgery: Secondary | ICD-10-CM | POA: Diagnosis not present

## 2022-07-28 DIAGNOSIS — Z96651 Presence of right artificial knee joint: Secondary | ICD-10-CM | POA: Diagnosis not present

## 2022-07-29 DIAGNOSIS — M1711 Unilateral primary osteoarthritis, right knee: Secondary | ICD-10-CM | POA: Diagnosis not present

## 2022-07-29 DIAGNOSIS — Z96651 Presence of right artificial knee joint: Secondary | ICD-10-CM | POA: Diagnosis not present

## 2022-07-29 DIAGNOSIS — M25561 Pain in right knee: Secondary | ICD-10-CM | POA: Diagnosis not present

## 2022-08-02 ENCOUNTER — Telehealth: Payer: Self-pay

## 2022-08-02 NOTE — Telephone Encounter (Signed)
I left a message on the number(s) listed in the patients chart requesting the patient to call back regarding the [facility] appointment for 09/01/2022. Rachel Russell's last day is 08/12/2022. The appointment has been canceled. Waiting for the patient to return the call.  NOTE: THIS APPOINTMENT WILL NEED TO BE RESCHEDULED WITH SALLY OR DR. COX ON OR AROUND 09/01/2022.

## 2022-08-04 ENCOUNTER — Other Ambulatory Visit: Payer: Self-pay

## 2022-08-04 DIAGNOSIS — E1122 Type 2 diabetes mellitus with diabetic chronic kidney disease: Secondary | ICD-10-CM

## 2022-08-05 DIAGNOSIS — M1711 Unilateral primary osteoarthritis, right knee: Secondary | ICD-10-CM | POA: Diagnosis not present

## 2022-08-05 DIAGNOSIS — M25561 Pain in right knee: Secondary | ICD-10-CM | POA: Diagnosis not present

## 2022-08-05 DIAGNOSIS — Z96651 Presence of right artificial knee joint: Secondary | ICD-10-CM | POA: Diagnosis not present

## 2022-08-05 MED ORDER — OZEMPIC (0.25 OR 0.5 MG/DOSE) 2 MG/3ML ~~LOC~~ SOPN: 0.2500 mg | PEN_INJECTOR | SUBCUTANEOUS | 0 refills | Status: DC

## 2022-08-08 DIAGNOSIS — N189 Chronic kidney disease, unspecified: Secondary | ICD-10-CM | POA: Diagnosis not present

## 2022-08-08 DIAGNOSIS — D631 Anemia in chronic kidney disease: Secondary | ICD-10-CM | POA: Diagnosis not present

## 2022-08-08 DIAGNOSIS — I129 Hypertensive chronic kidney disease with stage 1 through stage 4 chronic kidney disease, or unspecified chronic kidney disease: Secondary | ICD-10-CM | POA: Diagnosis not present

## 2022-08-08 DIAGNOSIS — E559 Vitamin D deficiency, unspecified: Secondary | ICD-10-CM | POA: Diagnosis not present

## 2022-08-08 DIAGNOSIS — E1122 Type 2 diabetes mellitus with diabetic chronic kidney disease: Secondary | ICD-10-CM | POA: Diagnosis not present

## 2022-08-08 DIAGNOSIS — N1832 Chronic kidney disease, stage 3b: Secondary | ICD-10-CM | POA: Diagnosis not present

## 2022-08-09 DIAGNOSIS — M1711 Unilateral primary osteoarthritis, right knee: Secondary | ICD-10-CM | POA: Diagnosis not present

## 2022-08-09 DIAGNOSIS — Z96651 Presence of right artificial knee joint: Secondary | ICD-10-CM | POA: Diagnosis not present

## 2022-08-09 DIAGNOSIS — M25561 Pain in right knee: Secondary | ICD-10-CM | POA: Diagnosis not present

## 2022-08-11 DIAGNOSIS — Z96651 Presence of right artificial knee joint: Secondary | ICD-10-CM | POA: Diagnosis not present

## 2022-08-11 DIAGNOSIS — M25561 Pain in right knee: Secondary | ICD-10-CM | POA: Diagnosis not present

## 2022-08-11 DIAGNOSIS — M1711 Unilateral primary osteoarthritis, right knee: Secondary | ICD-10-CM | POA: Diagnosis not present

## 2022-08-24 ENCOUNTER — Encounter: Payer: Self-pay | Admitting: Physician Assistant

## 2022-08-24 ENCOUNTER — Ambulatory Visit (INDEPENDENT_AMBULATORY_CARE_PROVIDER_SITE_OTHER): Payer: Medicare HMO | Admitting: Physician Assistant

## 2022-08-24 VITALS — BP 120/66 | HR 71 | Temp 96.9°F | Ht 61.0 in | Wt 192.0 lb

## 2022-08-24 DIAGNOSIS — J069 Acute upper respiratory infection, unspecified: Secondary | ICD-10-CM | POA: Diagnosis not present

## 2022-08-24 DIAGNOSIS — R112 Nausea with vomiting, unspecified: Secondary | ICD-10-CM | POA: Diagnosis not present

## 2022-08-24 LAB — POC COVID19 BINAXNOW: SARS Coronavirus 2 Ag: NEGATIVE

## 2022-08-24 LAB — POCT INFLUENZA A/B
Influenza A, POC: NEGATIVE
Influenza B, POC: NEGATIVE

## 2022-08-24 MED ORDER — FEXOFENADINE HCL 180 MG PO TABS
180.0000 mg | ORAL_TABLET | Freq: Every day | ORAL | 5 refills | Status: AC
Start: 2022-08-24 — End: ?

## 2022-08-24 MED ORDER — AZITHROMYCIN 250 MG PO TABS
ORAL_TABLET | ORAL | 0 refills | Status: AC
Start: 2022-08-24 — End: 2022-08-29

## 2022-08-24 NOTE — Progress Notes (Signed)
Acute Office Visit  Subjective:    Patient ID: Rachel Russell, female    DOB: 09/23/1955, 67 y.o.   MRN: [id number]  Chief Complaint  Patient presents with   Cough   Nasal Congestion   Facial Pain   Emesis   Nausea    HPI: Patient is in today for complaints of cough, congestion and malaise for the past several days.  Was running a fever but that has broken.  Says at times cough is productive.  This morning had some nausea and vomiting.  Denies diarrhea or abdominal pain. Husband in hospital with pneumonia  Past Medical History:  Diagnosis Date   Allergic rhinitis    Anxiety disorder    Bipolar disorder    Chest pain 05/31/2015   Formatting of this note might be different from the original. Seen at [facility] ED 04/12/15 with normal EKG, troponin-2, CTA chest and discharged   Dyslipidemia 02/09/2016   Essential (primary) hypertension    GERD without esophagitis    History of pulmonary embolism 02/09/2016   Hyperlipidemia    Obesity    Panic disorder (episodic paroxysmal anxiety)    Tachycardia 02/09/2016   Type 2 diabetes mellitus without complication    Vitamin D deficiency     Past Surgical History:  Procedure Laterality Date   CHOLECYSTECTOMY  2005   HERNIA REPAIR  2005   x2    Family History  Problem Relation Age of Onset   Leukemia Mother    Heart disease Mother        Pacemaker   Diabetes Father    Heart disease Father        Stents   Asthma Brother     Social History   Socioeconomic History   Marital status: Married    Spouse name: Zairah Stockel   Number of children: 3   Years of education: Not on file   Highest education level: Some college, no degree  Occupational History   Not on file  Tobacco Use   Smoking status: Former    Years: 5    Types: Cigarettes    Quit date: 11/2018    Years since quitting: 3.7   Smokeless tobacco: Never  Vaping Use   Vaping Use: Every day  Substance and Sexual Activity   Alcohol use: Never   Drug use: Never    Sexual activity: Not Currently  Other Topics Concern   Not on file  Social History Narrative   Not on file   Social Determinants of Health   Financial Resource Strain: High Risk (07/10/2022)   Overall Financial Resource Strain (CARDIA)    Difficulty of Paying Living Expenses: Very hard  Food Insecurity: Food Insecurity Present (07/10/2022)   Hunger Vital Sign    Worried About Running Out of Food in the Last Year: Often true    Ran Out of Food in the Last Year: Often true  Transportation Needs: No Transportation Needs (07/10/2022)   PRAPARE - Hydrologist (Medical): No    Lack of Transportation (Non-Medical): No  Physical Activity: Insufficiently Active (07/10/2022)   Exercise Vital Sign    Days of Exercise per Week: 2 days    Minutes of Exercise per Session: 20 min  Stress: Stress Concern Present (07/10/2022)   [place]    Feeling of Stress : Rather much  Social Connections: Socially Isolated (07/10/2022)   Social Connection and Isolation Panel [NHANES]  Frequency of Communication with Friends and Family: Never    Frequency of Social Gatherings with Friends and Family: Never    Attends Religious Services: Never    Marine scientist or Organizations: No    Attends Archivist Meetings: Never    Marital Status: Married  Human resources officer Violence: Not At Risk (02/23/2022)   Humiliation, Afraid, Rape, and Kick questionnaire    Fear of Current or Ex-Partner: No    Emotionally Abused: No    Physically Abused: No    Sexually Abused: No    Outpatient Medications Prior to Visit  Medication Sig Dispense Refill   atorvastatin (LIPITOR) 20 MG tablet Take 1 tablet (20 mg total) by mouth daily. 90 tablet 3   buPROPion (WELLBUTRIN XL) 150 MG 24 hr tablet Take 150 mg by mouth every morning.     dapagliflozin propanediol (FARXIGA) 10 MG TABS tablet Take 1 tablet (10 mg total) by  mouth daily before breakfast. 30 tablet 2   famotidine (PEPCID) 20 MG tablet Take 1 tablet by mouth daily.     lamoTRIgine (LAMICTAL) 100 MG tablet Take 300 mg by mouth daily. 1 in am and 2 in pm     LORazepam (ATIVAN) 0.5 MG tablet Take 0.5 mg by mouth 2 (two) times daily as needed for anxiety.     oxyCODONE (OXY IR/ROXICODONE) 5 MG immediate release tablet Take by mouth.     PARoxetine (PAXIL) 40 MG tablet Take 40 mg by mouth at bedtime.     propranolol ER (INDERAL LA) 60 MG 24 hr capsule Take 60 mg by mouth daily.     Rivaroxaban (XARELTO) 15 MG TABS tablet Take 1 tablet (15 mg total) by mouth daily with supper. 42 tablet 2   Semaglutide,0.25 or 0.5MG /DOS, (OZEMPIC, 0.25 OR 0.5 MG/DOSE,) 2 MG/3ML SOPN Inject 0.25 mg into the skin once a week. 3 mL 0   tiZANidine (ZANAFLEX) 2 MG tablet Take by mouth.     traZODone (DESYREL) 150 MG tablet Take 150 mg by mouth at bedtime.     fexofenadine (ALLEGRA) 180 MG tablet Take 180 mg by mouth daily.     No facility-administered medications prior to visit.    Allergies  Allergen Reactions   Iodinated Contrast Media Anaphylaxis   Shellfish-Derived Products    Codeine Rash   Latex Rash   Penicillins Rash    Review of Systems CONSTITUTIONAL: see HPI E/N/T: see HPI CARDIOVASCULAR: Negative for chest pain, dizziness, palpitations and pedal edema.  RESPIRATORY: see HPI GASTROINTESTINAL: see HPI MSK: Negative for arthralgias and myalgias.  INTEGUMENTARY: Negative for rash.          Objective:    PHYSICAL EXAM:   VS: BP 120/66 (BP Location: Right Arm, Patient Position: Sitting, Cuff Size: Large)   Pulse 71   Temp (!) 96.9 F (36.1 C) (Temporal)   Ht 5\' 1"  (1.549 m)   Wt 192 lb (87.1 kg)   SpO2 98%   BMI 36.28 kg/m   GEN: Well nourished, well developed, in no acute distress  HEENT: normal external ears and nose - normal external auditory canals and TMS - - Lips, Teeth and Gums - normal  Oropharynx - erythema/pnd Cardiac: RRR; no  murmurs, rubs,  Respiratory:  faint scattered rhonchi - clears with cough Skin: warm and dry, no rash   Office Visit on 08/24/2022  Component Date Value Ref Range Status   SARS Coronavirus 2 Ag 08/24/2022 Negative  Negative Final   Influenza A,  POC 08/24/2022 Negative  Negative Final   Influenza B, POC 08/24/2022 Negative  Negative Final      Health Maintenance Due  Topic Date Due   Pneumonia Vaccine 65+ Years old (1 of 2 - PCV) Never done   DTaP/Tdap/Td (1 - Tdap) Never done   Fecal DNA (Cologuard)  Never done   MAMMOGRAM  Never done   Zoster Vaccines- Shingrix (1 of 2) Never done   DEXA SCAN  Never done    There are no preventive care reminders to display for this patient.   Lab Results  Component Value Date   TSH 1.070 02/23/2022   Lab Results  Component Value Date   WBC 7.3 06/01/2022   HGB 13.0 06/01/2022   HCT 39.1 06/01/2022   MCV 91 06/01/2022   PLT 345 06/01/2022   Lab Results  Component Value Date   NA 140 06/01/2022   K 4.0 06/01/2022   CO2 22 06/01/2022   GLUCOSE 103 (H) 06/01/2022   BUN 20 06/01/2022   CREATININE 2.04 (H) 06/01/2022   BILITOT 0.2 06/01/2022   ALKPHOS 106 06/01/2022   AST 21 06/01/2022   ALT 18 06/01/2022   PROT 7.1 06/01/2022   ALBUMIN 4.7 06/01/2022   CALCIUM 9.6 06/01/2022   EGFR 27 (L) 06/01/2022   Lab Results  Component Value Date   CHOL 212 (H) 06/01/2022   Lab Results  Component Value Date   HDL 41 06/01/2022   Lab Results  Component Value Date   LDLCALC 129 (H) 06/01/2022   Lab Results  Component Value Date   TRIG 238 (H) 06/01/2022   Lab Results  Component Value Date   CHOLHDL 5.2 (H) 06/01/2022   Lab Results  Component Value Date   HGBA1C 6.0 (H) 06/01/2022       Assessment & Plan:  Acute upper respiratory infection -     POC COVID-19 BinaxNow -     POCT Influenza A/B -     Azithromycin; Take 2 tablets on day 1, then 1 tablet daily on days 2 through 5  Dispense: 6 tablet; Refill: 0 -      Fexofenadine HCl; Take 1 tablet (180 mg total) by mouth daily.  Dispense: 30 tablet; Refill: 5  Nausea and vomiting, unspecified vomiting type Pt states she has zofran at home to take as needed Recommend clear liquids/bland diet Follow up if symptoms persist or worsen    Meds ordered this encounter  Medications   azithromycin (ZITHROMAX) 250 MG tablet    Sig: Take 2 tablets on day 1, then 1 tablet daily on days 2 through 5    Dispense:  6 tablet    Refill:  0    Order Specific Question:   Supervising Provider    Answer:   Shelton Silvas   fexofenadine (ALLEGRA) 180 MG tablet    Sig: Take 1 tablet (180 mg total) by mouth daily.    Dispense:  30 tablet    Refill:  5    Order Specific Question:   Supervising Provider    AnswerShelton Silvas    Orders Placed This Encounter  Procedures   POC COVID-19 BinaxNow   POCT Influenza A/B     Follow-up: Return if symptoms worsen or fail to improve.  An After Visit Summary was printed and given to the patient.  Yetta Flock [facility] [phone]

## 2022-09-01 ENCOUNTER — Ambulatory Visit: Payer: Medicare HMO | Admitting: Nurse Practitioner

## 2022-09-09 ENCOUNTER — Other Ambulatory Visit: Payer: Self-pay | Admitting: Family Medicine

## 2022-09-09 DIAGNOSIS — N1832 Chronic kidney disease, stage 3b: Secondary | ICD-10-CM

## 2022-09-13 DIAGNOSIS — Z96651 Presence of right artificial knee joint: Secondary | ICD-10-CM | POA: Diagnosis not present

## 2022-09-13 DIAGNOSIS — M25561 Pain in right knee: Secondary | ICD-10-CM | POA: Diagnosis not present

## 2022-09-13 DIAGNOSIS — M1711 Unilateral primary osteoarthritis, right knee: Secondary | ICD-10-CM | POA: Diagnosis not present

## 2022-09-16 DIAGNOSIS — Z96651 Presence of right artificial knee joint: Secondary | ICD-10-CM | POA: Diagnosis not present

## 2022-09-16 DIAGNOSIS — M25561 Pain in right knee: Secondary | ICD-10-CM | POA: Diagnosis not present

## 2022-09-16 DIAGNOSIS — M1711 Unilateral primary osteoarthritis, right knee: Secondary | ICD-10-CM | POA: Diagnosis not present

## 2022-09-19 DIAGNOSIS — M1711 Unilateral primary osteoarthritis, right knee: Secondary | ICD-10-CM | POA: Diagnosis not present

## 2022-09-19 DIAGNOSIS — M25561 Pain in right knee: Secondary | ICD-10-CM | POA: Diagnosis not present

## 2022-09-19 DIAGNOSIS — Z96651 Presence of right artificial knee joint: Secondary | ICD-10-CM | POA: Diagnosis not present

## 2022-09-21 DIAGNOSIS — M25561 Pain in right knee: Secondary | ICD-10-CM | POA: Diagnosis not present

## 2022-09-21 DIAGNOSIS — M1711 Unilateral primary osteoarthritis, right knee: Secondary | ICD-10-CM | POA: Diagnosis not present

## 2022-09-21 DIAGNOSIS — Z96651 Presence of right artificial knee joint: Secondary | ICD-10-CM | POA: Diagnosis not present

## 2022-09-26 ENCOUNTER — Ambulatory Visit: Payer: Medicare HMO | Admitting: Family Medicine

## 2022-09-28 DIAGNOSIS — Z96651 Presence of right artificial knee joint: Secondary | ICD-10-CM | POA: Diagnosis not present

## 2022-09-28 DIAGNOSIS — M25561 Pain in right knee: Secondary | ICD-10-CM | POA: Diagnosis not present

## 2022-09-28 DIAGNOSIS — M1711 Unilateral primary osteoarthritis, right knee: Secondary | ICD-10-CM | POA: Diagnosis not present

## 2022-09-29 DIAGNOSIS — Z96651 Presence of right artificial knee joint: Secondary | ICD-10-CM | POA: Diagnosis not present

## 2022-10-03 ENCOUNTER — Encounter: Payer: Self-pay | Admitting: Physician Assistant

## 2022-10-03 ENCOUNTER — Ambulatory Visit (INDEPENDENT_AMBULATORY_CARE_PROVIDER_SITE_OTHER): Payer: Medicare HMO | Admitting: Physician Assistant

## 2022-10-03 VITALS — BP 116/70 | HR 63 | Temp 97.5°F | Ht 61.0 in | Wt 188.0 lb

## 2022-10-03 DIAGNOSIS — I10 Essential (primary) hypertension: Secondary | ICD-10-CM

## 2022-10-03 DIAGNOSIS — N1832 Chronic kidney disease, stage 3b: Secondary | ICD-10-CM | POA: Diagnosis not present

## 2022-10-03 DIAGNOSIS — E1122 Type 2 diabetes mellitus with diabetic chronic kidney disease: Secondary | ICD-10-CM

## 2022-10-03 DIAGNOSIS — N289 Disorder of kidney and ureter, unspecified: Secondary | ICD-10-CM

## 2022-10-03 DIAGNOSIS — K219 Gastro-esophageal reflux disease without esophagitis: Secondary | ICD-10-CM | POA: Diagnosis not present

## 2022-10-03 DIAGNOSIS — Z86711 Personal history of pulmonary embolism: Secondary | ICD-10-CM

## 2022-10-03 DIAGNOSIS — F419 Anxiety disorder, unspecified: Secondary | ICD-10-CM | POA: Diagnosis not present

## 2022-10-03 DIAGNOSIS — E782 Mixed hyperlipidemia: Secondary | ICD-10-CM

## 2022-10-03 DIAGNOSIS — E1165 Type 2 diabetes mellitus with hyperglycemia: Secondary | ICD-10-CM

## 2022-10-03 MED ORDER — RIVAROXABAN 15 MG PO TABS: 15.0000 mg | ORAL_TABLET | Freq: Every day | ORAL | 2 refills | Status: DC

## 2022-10-03 NOTE — Assessment & Plan Note (Signed)
Well controlled.  Continue to work on eating a healthy diet and exercise.  Labs drawn today.   No major side effects reported, and no issues with compliance. The current medical regimen is effective;  continue present plan with Pepcid 20mg 

## 2022-10-03 NOTE — Progress Notes (Signed)
Subjective:  Patient ID: Rachel Russell, female    DOB: 10-Sep-1955  Age: 67 y.o. MRN: [id number]  Chief Complaint  Patient presents with   Medical Management of Chronic Issues    HPI Diabetes:  Complications: Glucose checking: Glucose logs:100-105 Hypoglycemia: Once instance she dropped to 55 but drank Orange juice to bring it back up Most recent A1C: 6.0 Current medications: Farxiga 10 mg daily, Ozempic 0.5 mg weekly Last Eye Exam: 01/07/2022 Foot checks: 06/15/2022  Anxiety: Wellbutrin 150 mg, Paxil 40 mg, and lorazepam 0.5 mg  as needed. Dr. Dellia Cloud is her psychiatrist. She has an appointment next Friday and will discuss with him her depression and anxiety increase.  Patient denies any suicidal or homicidal ideations at this time.  Patient states she has a lot of increased stress and anxiety due to her home life.  States that she is having to do a lot of the cleaning and cooking engines while recovering from her knee surgery.  Emphasized with her the importance of keeping the appointment as I see that her scores are higher and her PHQ-9 and GAD-7.  She said that she agrees and she will make sure to follow-up with Dr. Thad Ranger.  GERD: Pepcid 20 mg daily.  Hyperlipidemia: Current medications: Lipitor 20 mg daily  Hypertension: Complications:none   Diet: regular Exercise: Not able to do much with her knee pain  Pt is a former smoker, quit 2017. 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     Rimsha has bilateral chronic knee pain. 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     10/03/2022    8:11 AM 07/14/2022    3:06 PM 02/23/2022   11:09 AM  Depression screen PHQ 2/9  Decreased Interest 3 3 3   Down, Depressed, Hopeless 3 3 3   PHQ - 2 Score 6 6 6   Altered sleeping 3 0 0  Tired, decreased  energy 3 1 2   Change in appetite 3 2 3   Feeling bad or failure about yourself  3 3 3   Trouble concentrating 3 3 3   Moving slowly or fidgety/restless 0 0 3  Suicidal thoughts  0 0  PHQ-9 Score 21 15 20   Difficult doing work/chores Somewhat difficult Somewhat difficult Very difficult        10/03/2022    8:11 AM  Fall Risk   Falls in the past year? 0  Number falls in past yr: 0  Injury with Fall? 0  Risk for fall due to : No Fall Risks  Follow up Falls evaluation completed    Patient Care Team: Langley Gauss, [place] as PCP - General (Physician Assistant) Pa, [facility] Dannielle Huh, MD as Consulting Physician (Orthopedic Surgery) Maxie Barb, MD as Consulting Physician (Nephrology)   Review of Systems  Constitutional:  Negative for fatigue.  HENT:  Negative for congestion, ear pain and sore throat.   Respiratory:  Negative for cough and shortness of breath.   Cardiovascular:  Negative for chest pain.  Gastrointestinal:  Negative for abdominal pain, constipation, diarrhea, nausea and vomiting.  Genitourinary:  Negative for dysuria, frequency and urgency.  Musculoskeletal:  Negative for arthralgias, back pain and myalgias.  Neurological:  Negative for dizziness and headaches.  Psychiatric/Behavioral:  Negative for agitation and sleep disturbance. The patient is not nervous/anxious.  Current Outpatient Medications on File Prior to Visit  Medication Sig Dispense Refill   atorvastatin (LIPITOR) 20 MG tablet Take 1 tablet (20 mg total) by mouth daily. 90 tablet 3   buPROPion (WELLBUTRIN XL) 150 MG 24 hr tablet Take 150 mg by mouth every morning.     dapagliflozin propanediol (FARXIGA) 10 MG TABS tablet Take 1 tablet (10 mg total) by mouth daily before breakfast. 30 tablet 2   famotidine (PEPCID) 20 MG tablet Take 1 tablet by mouth daily.     fexofenadine (ALLEGRA) 180 MG tablet Take 1 tablet (180 mg total) by mouth daily. 30 tablet 5   lamoTRIgine  (LAMICTAL) 100 MG tablet Take 300 mg by mouth daily. 1 in am and 2 in pm     LORazepam (ATIVAN) 0.5 MG tablet Take 0.5 mg by mouth 2 (two) times daily as needed for anxiety.     PARoxetine (PAXIL) 40 MG tablet Take 40 mg by mouth at bedtime.     propranolol ER (INDERAL LA) 60 MG 24 hr capsule Take 60 mg by mouth daily.     Semaglutide,0.25 or 0.5MG /DOS, (OZEMPIC, 0.25 OR 0.5 MG/DOSE,) 2 MG/3ML SOPN INJECT 0.25MG  INTO THE SKIN ONE TIME PER WEEK 9 mL 0   traZODone (DESYREL) 150 MG tablet Take 150 mg by mouth at bedtime.     No current facility-administered medications on file prior to visit.   Past Medical History:  Diagnosis Date   Allergic rhinitis    Anxiety disorder    Bipolar disorder (HCC)    Chest pain 05/31/2015   Formatting of this note might be different from the original. Seen at [facility] ED 04/12/15 with normal EKG, troponin-2, CTA chest and discharged   Dyslipidemia 02/09/2016   Essential (primary) hypertension    GERD without esophagitis    History of pulmonary embolism 02/09/2016   Hyperlipidemia    Obesity    Panic disorder (episodic paroxysmal anxiety)    Tachycardia 02/09/2016   Type 2 diabetes mellitus without complication (HCC)    Vitamin D deficiency    Past Surgical History:  Procedure Laterality Date   CHOLECYSTECTOMY  2005   HERNIA REPAIR  2005   x2    Family History  Problem Relation Age of Onset   Leukemia Mother    Heart disease Mother        Pacemaker   Diabetes Father    Heart disease Father        Stents   Asthma Brother    Social History   Socioeconomic History   Marital status: Married    Spouse name: Yensi Daughety   Number of children: 3   Years of education: Not on file   Highest education level: Some college, no degree  Occupational History   Not on file  Tobacco Use   Smoking status: Former    Years: 5    Types: Cigarettes    Quit date: 11/2018    Years since quitting: 3.9   Smokeless tobacco: Never  Vaping Use   Vaping Use:  Every day  Substance and Sexual Activity   Alcohol use: Never   Drug use: Never   Sexual activity: Not Currently  Other Topics Concern   Not on file  Social History Narrative   Not on file   Social Determinants of Health   Financial Resource Strain: High Risk (07/10/2022)   Overall Financial Resource Strain (CARDIA)    Difficulty of Paying Living Expenses: Very hard  Food Insecurity: Food Insecurity  Present (07/10/2022)   Hunger Vital Sign    Worried About Running Out of Food in the Last Year: Often true    Ran Out of Food in the Last Year: Often true  Transportation Needs: No Transportation Needs (07/10/2022)   PRAPARE - Administrator, Civil Service (Medical): No    Lack of Transportation (Non-Medical): No  Physical Activity: Insufficiently Active (07/10/2022)   Exercise Vital Sign    Days of Exercise per Week: 2 days    Minutes of Exercise per Session: 20 min  Stress: Stress Concern Present (07/10/2022)   [employer] of Occupational Health - Occupational Stress Questionnaire    Feeling of Stress : Rather much  Social Connections: Socially Isolated (07/10/2022)   Social Connection and Isolation Panel [NHANES]    Frequency of Communication with Friends and Family: Never    Frequency of Social Gatherings with Friends and Family: Never    Attends Religious Services: Never    Database administrator or Organizations: No    Attends Engineer, structural: Never    Marital Status: Married    Objective:  BP 116/70 (BP Location: Left Arm, Patient Position: Sitting, Cuff Size: Large)   Pulse 63   Temp (!) 97.5 F (36.4 C)   Ht 5\' 1"  (1.549 m)   Wt 188 lb (85.3 kg)   SpO2 99%   BMI 35.52 kg/m      10/03/2022    7:53 AM 08/24/2022    9:40 AM 07/11/2022   12:58 PM  BP/Weight  Systolic BP 116 120 96  Diastolic BP 70 66 80  Wt. (Lbs) 188 192 199  BMI 35.52 kg/m2 36.28 kg/m2 37.6 kg/m2    Physical Exam Constitutional:      Appearance: Normal  appearance.  Cardiovascular:     Rate and Rhythm: Normal rate and regular rhythm.     Heart sounds: Normal heart sounds.  Pulmonary:     Effort: Pulmonary effort is normal.     Breath sounds: Normal breath sounds.  Abdominal:     General: Bowel sounds are normal.     Palpations: Abdomen is soft.  Neurological:     Mental Status: She is alert and oriented to person, place, and time.  Psychiatric:        Mood and Affect: Mood normal.        Behavior: Behavior normal.        Thought Content: Thought content normal.     Diabetic Foot Exam - Simple   No data filed      Lab Results  Component Value Date   WBC 7.1 10/03/2022   HGB 12.8 10/03/2022   HCT 39.5 10/03/2022   PLT 302 10/03/2022   GLUCOSE 66 (L) 10/03/2022   CHOL 160 10/03/2022   TRIG 179 (H) 10/03/2022   HDL 41 10/03/2022   LDLCALC 88 10/03/2022   ALT 17 10/03/2022   AST 20 10/03/2022   NA 142 10/03/2022   K 4.1 10/03/2022   CL 105 10/03/2022   CREATININE 1.75 (H) 10/03/2022   BUN 20 10/03/2022   CO2 24 10/03/2022   TSH 1.070 02/23/2022   HGBA1C 5.6 10/03/2022      Assessment & Plan:    Essential (primary) hypertension Assessment & Plan: Well controlled.  Continue to work on eating a healthy diet and exercise.  Labs drawn today.   No major side effects reported, and no issues with compliance. The current medical regimen is effective;  continue present  plan    GERD without esophagitis Assessment & Plan: Well controlled.  Continue to work on eating a healthy diet and exercise.  Labs drawn today.   No major side effects reported, and no issues with compliance. The current medical regimen is effective;  continue present plan with Pepcid 20mg    Type 2 diabetes mellitus with stage 3b chronic kidney disease, without long-term current use of insulin (HCC) Assessment & Plan: Well controlled.  Continue to work on eating a healthy diet and exercise.  Labs drawn today.   No major side effects  reported, and no issues with compliance. The current medical regimen is effective;  continue present plan with Farxiga 10mg , Ozempic .25/.5mg .   Orders: -     CBC with Differential/Platelet -     Comprehensive metabolic panel -     Hemoglobin A1c -     Cardiovascular Risk Assessment  Anxiety disorder, unspecified type Assessment & Plan: Patient is currently taking Ativan 1.5 mg, Wellbutrin 150 mg, and Trazodone 150mg . Patient attributes higher scores to home life. Will follow up with her psychiatrist Dr.Renolds to adjust her medications at this time due do her Bipolar we don't want to adjust anything and push patient to a Manic state.   Mixed hyperlipidemia Assessment & Plan: Well controlled.  Continue to work on eating a healthy diet and exercise.  Labs drawn today.   No major side effects reported, and no issues with compliance. The current medical regimen is effective;  continue present plan with Lipitor 20mg   Orders: -     Lipid panel  History of pulmonary embolism Assessment & Plan: Well controlled.  Continue to work on eating a healthy diet and exercise.  Labs drawn today.   No major side effects reported, and no issues with compliance. The current medical regimen is effective;  continue present plan with [place] 15mg   Orders: -     Rivaroxaban; Take 1 tablet (15 mg total) by mouth daily with supper.  Dispense: 42 tablet; Refill: 2     Meds ordered this encounter  Medications   Rivaroxaban (XARELTO) 15 MG TABS tablet    Sig: Take 1 tablet (15 mg total) by mouth daily with supper.    Dispense:  42 tablet    Refill:  2    Orders Placed This Encounter  Procedures   CBC with Differential/Platelet   Comprehensive metabolic panel   Lipid panel   Hemoglobin A1c   Cardiovascular Risk Assessment     Follow-up: Return in about 3 months (around 01/03/2023) for fasting.   I,Jacqua L Marsh,acting as a scribe for [employer], PA.,have documented all relevant  documentation on the behalf of Langley Gauss, PA,as directed by  Langley Gauss, PA while in the presence of Langley Gauss, [place].   An After Visit Summary was printed and given to the patient.  Langley Gauss, [place] [facility] [phone]

## 2022-10-03 NOTE — Assessment & Plan Note (Signed)
Patient is currently taking Ativan 1.5 mg, Wellbutrin 150 mg, and Trazodone 150mg . Patient attributes higher scores to home life. Will follow up with her psychiatrist Dr.Renolds to adjust her medications at this time due do her Bipolar we don't want to adjust anything and push patient to a Manic state.

## 2022-10-03 NOTE — Assessment & Plan Note (Signed)
Well controlled.  Continue to work on eating a healthy diet and exercise.  Labs drawn today.   No major side effects reported, and no issues with compliance. The current medical regimen is effective;  continue present plan with Xeralto 15mg 

## 2022-10-03 NOTE — Assessment & Plan Note (Signed)
Well controlled.  Continue to work on eating a healthy diet and exercise.  Labs drawn today.   No major side effects reported, and no issues with compliance. The current medical regimen is effective;  continue present plan  

## 2022-10-03 NOTE — Assessment & Plan Note (Signed)
Well controlled.  Continue to work on eating a healthy diet and exercise.  Labs drawn today.   No major side effects reported, and no issues with compliance. The current medical regimen is effective;  continue present plan with Lipitor 20mg 

## 2022-10-03 NOTE — Assessment & Plan Note (Signed)
Well controlled.  Continue to work on eating a healthy diet and exercise.  Labs drawn today.   No major side effects reported, and no issues with compliance. The current medical regimen is effective;  continue present plan with Farxiga 10mg , Ozempic .25/.5mg .

## 2022-10-03 NOTE — Patient Instructions (Signed)
Patient will ask about Shingles and Pneumonia vaccine [facility] the [facility]. We will also look at getting her there [place] and Diptheria part the the Tdap shot as she states she is allergic to the Pertussis part where she gets severe flu like symptoms.   Things to do to keep yourself healthy  - Exercise at least 30-45 minutes a day, 3-4 days a week.  - Eat a low-fat diet with lots of fruits and vegetables, up to 7-9 servings per day.  - Seatbelts can save your life. Wear them always.  - Smoke detectors on every level of your home, check batteries every year.  - Eye Doctor - have an eye exam every 1-2 years  - Safe sex - if you may be exposed to STDs, use a condom.  - Alcohol -  If you drink, do it moderately, less than 2 drinks per day.  - Health Care Power of Attorney. Choose someone to speak for you if you are not able.  - Depression is common in our stressful world.If you're feeling down or losing interest in things you normally enjoy, please come in for a visit.  - Violence - If anyone is threatening or hurting you, please call immediately.

## 2022-10-04 LAB — LIPID PANEL
Chol/HDL Ratio: 3.9 ratio (ref 0.0–4.4)
Cholesterol, Total: 160 mg/dL (ref 100–199)
HDL: 41 mg/dL (ref >39)
LDL Chol Calc (NIH): 88 mg/dL (ref 0–99)
Triglycerides: 179 mg/dL — ABNORMAL HIGH (ref 0–149)
VLDL Cholesterol Cal: 31 mg/dL (ref 5–40)

## 2022-10-04 LAB — CBC WITH DIFFERENTIAL/PLATELET
Basophils Absolute: 0 10*3/uL (ref 0.0–0.2)
Basos: 1 %
EOS (ABSOLUTE): 0.2 10*3/uL (ref 0.0–0.4)
Eos: 3 %
Hematocrit: 39.5 % (ref 34.0–46.6)
Hemoglobin: 12.8 g/dL (ref 11.1–15.9)
Immature Grans (Abs): 0 10*3/uL (ref 0.0–0.1)
Immature Granulocytes: 0 %
Lymphocytes Absolute: 2.1 10*3/uL (ref 0.7–3.1)
Lymphs: 29 %
MCH: 29.1 pg (ref 26.6–33.0)
MCHC: 32.4 g/dL (ref 31.5–35.7)
MCV: 90 fL (ref 79–97)
Monocytes Absolute: 0.5 10*3/uL (ref 0.1–0.9)
Monocytes: 7 %
Neutrophils Absolute: 4.3 10*3/uL (ref 1.4–7.0)
Neutrophils: 60 %
Platelets: 302 10*3/uL (ref 150–450)
RBC: 4.4 x10E6/uL (ref 3.77–5.28)
RDW: 13.2 % (ref 11.7–15.4)
WBC: 7.1 10*3/uL (ref 3.4–10.8)

## 2022-10-04 LAB — COMPREHENSIVE METABOLIC PANEL
ALT: 17 IU/L (ref 0–32)
AST: 20 IU/L (ref 0–40)
Albumin/Globulin Ratio: 1.8 (ref 1.2–2.2)
Albumin: 4.3 g/dL (ref 3.9–4.9)
Alkaline Phosphatase: 106 IU/L (ref 44–121)
BUN/Creatinine Ratio: 11 — ABNORMAL LOW (ref 12–28)
BUN: 20 mg/dL (ref 8–27)
Bilirubin Total: <0.2 mg/dL (ref 0.0–1.2)
CO2: 24 mmol/L (ref 20–29)
Calcium: 9.9 mg/dL (ref 8.7–10.3)
Chloride: 105 mmol/L (ref 96–106)
Creatinine, Ser: 1.75 mg/dL — ABNORMAL HIGH (ref 0.57–1.00)
Globulin, Total: 2.4 g/dL (ref 1.5–4.5)
Glucose: 66 mg/dL — ABNORMAL LOW (ref 70–99)
Potassium: 4.1 mmol/L (ref 3.5–5.2)
Sodium: 142 mmol/L (ref 134–144)
Total Protein: 6.7 g/dL (ref 6.0–8.5)
eGFR: 32 mL/min/{1.73_m2} — ABNORMAL LOW (ref >59)

## 2022-10-04 LAB — CARDIOVASCULAR RISK ASSESSMENT

## 2022-10-04 LAB — HEMOGLOBIN A1C
Est. average glucose Bld gHb Est-mCnc: 114 mg/dL
Hgb A1c MFr Bld: 5.6 % (ref 4.8–5.6)

## 2022-11-23 DIAGNOSIS — F3132 Bipolar disorder, current episode depressed, moderate: Secondary | ICD-10-CM | POA: Diagnosis not present

## 2022-11-23 DIAGNOSIS — F5101 Primary insomnia: Secondary | ICD-10-CM | POA: Diagnosis not present

## 2022-11-23 DIAGNOSIS — F411 Generalized anxiety disorder: Secondary | ICD-10-CM | POA: Diagnosis not present

## 2022-11-23 DIAGNOSIS — F410 Panic disorder [episodic paroxysmal anxiety] without agoraphobia: Secondary | ICD-10-CM | POA: Diagnosis not present

## 2022-11-29 ENCOUNTER — Telehealth: Payer: Self-pay

## 2022-11-29 NOTE — Telephone Encounter (Signed)
Patient called to inquire to see if we have any Xarelto 15 mg samples to get her through till she gets her monthly check. She states she is going to run out and wont have enough. I checked our current samples and we only have the Xarelto 20 mg in samples. I told the patient to check with her Cardiologist to see if they have samples that she can have and if not to call us back and let us know and we will figure out what to do next. Patient understood verbally and states that she will call us back if her cardiologist does not have any samples of the Xarelto 15 mg.

## 2022-12-01 DIAGNOSIS — Z96651 Presence of right artificial knee joint: Secondary | ICD-10-CM | POA: Diagnosis not present

## 2023-01-04 DIAGNOSIS — F410 Panic disorder [episodic paroxysmal anxiety] without agoraphobia: Secondary | ICD-10-CM | POA: Diagnosis not present

## 2023-01-04 DIAGNOSIS — F411 Generalized anxiety disorder: Secondary | ICD-10-CM | POA: Diagnosis not present

## 2023-01-04 DIAGNOSIS — F3132 Bipolar disorder, current episode depressed, moderate: Secondary | ICD-10-CM | POA: Diagnosis not present

## 2023-01-04 DIAGNOSIS — F5101 Primary insomnia: Secondary | ICD-10-CM | POA: Diagnosis not present

## 2023-01-08 NOTE — Progress Notes (Unsigned)
Subjective:  Patient ID: Rachel Russell, female    DOB: 25-Nov-1955  Age: 67 y.o. MRN: [id number]  Chief Complaint  Patient presents with   Medical Management of Chronic Issues    HPI   Diabetes:  Complications: Glucose checking: Glucose logs:130-150 Hypoglycemia: none Most recent A1C: 5.6 Current medications: Farxiga 10 mg daily, Ozempic 0.5 mg weekly Last Eye Exam: 01/07/2022 Foot checks: daily  Anxiety: Wellbutrin 150 mg, Paxil 40 mg, and lorazepam 0.5 mg  as needed. Dr. Stacey Drain is her psychiatrist. Spoke with him about high anxiety and he said they can up dose if needed, but she did not want to at this time. Will continue to monitor symptoms and discuss it with Dr.Renolds if symptoms worsen or change. She will look at taking her Ativan daily to help for now during this high stressful time. Is concerned about addiction and discussed with her about continuing to monitor her current situation and if the outside stress decreases and she still wants to take her medicine daily to let us know and we will make adjustments as needed.   GERD: Pepcid 20 mg daily.  Hyperlipidemia: Current medications: Atorvastatin 20 mg daily.  Hypertension: Complications:none   Diet: regular Exercise: Not able to do much with her knee pain  Pt is a former smoker, quit 2017. 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      01/09/2023    8:21 AM 10/03/2022    8:11 AM 07/14/2022    3:06 PM 02/23/2022   11:09 AM  Depression screen PHQ 2/9  Decreased Interest 2 3 3 3   Down, Depressed, Hopeless 3 3 3 3   PHQ - 2 Score 5 6 6 6   Altered sleeping 3 3 0 0  Tired, decreased energy 2 3 1 2   Change in appetite 3 3 2 3   Feeling bad or failure about yourself  3 3 3 3   Trouble concentrating 2 3 3 3   Moving slowly or fidgety/restless 3 0 0 3  Suicidal thoughts 0  0 0  PHQ-9 Score 21 21 15 20   Difficult doing work/chores Not difficult at all Somewhat difficult Somewhat difficult Very  difficult        01/09/2023    8:21 AM  Fall Risk   Falls in the past year? 0  Number falls in past yr: 0  Injury with Fall? 0  Risk for fall due to : No Fall Risks  Follow up Education provided    Patient Care Team: Langley Gauss, PA as PCP - General (Physician Assistant) Pa, [facility] Dannielle Huh, MD as Consulting Physician (Orthopedic Surgery) Maxie Barb, MD as Consulting Physician (Nephrology)   Review of Systems  Constitutional:  Negative for chills, fatigue and fever.  HENT:  Negative for congestion, ear pain and sore throat.   Respiratory:  Negative for cough and shortness of breath.   Cardiovascular:  Negative for chest pain and palpitations.  Gastrointestinal:  Negative for abdominal pain, constipation, diarrhea, nausea and vomiting.  Endocrine: Negative for polydipsia, polyphagia and polyuria.  Genitourinary:  Negative for difficulty urinating and dysuria.  Musculoskeletal:  Negative for arthralgias, back pain and myalgias.  Skin:  Negative for rash.  Neurological:  Negative for headaches.  Psychiatric/Behavioral:  Negative for dysphoric mood. The patient is nervous/anxious.     Current Outpatient Medications on File Prior to Visit  Medication Sig Dispense Refill   atorvastatin (LIPITOR) 20 MG tablet Take 1 tablet (20 mg total) by mouth daily.  90 tablet 3   buPROPion (WELLBUTRIN XL) 150 MG 24 hr tablet Take 150 mg by mouth every morning.     famotidine (PEPCID) 20 MG tablet Take 1 tablet by mouth daily.     fexofenadine (ALLEGRA) 180 MG tablet Take 1 tablet (180 mg total) by mouth daily. 30 tablet 5   lamoTRIgine (LAMICTAL) 100 MG tablet Take 300 mg by mouth daily. 1 in am and 2 in pm     LORazepam (ATIVAN) 0.5 MG tablet Take 0.5 mg by mouth 2 (two) times daily as needed for anxiety.     PARoxetine (PAXIL) 40 MG tablet Take 40 mg by mouth at bedtime.     propranolol ER (INDERAL LA) 60 MG 24 hr capsule Take 60 mg by mouth daily.      traZODone (DESYREL) 150 MG tablet Take 150 mg by mouth at bedtime.     No current facility-administered medications on file prior to visit.   Past Medical History:  Diagnosis Date   Allergic rhinitis    Anxiety disorder    Bipolar disorder (HCC)    Chest pain 05/31/2015   Formatting of this note might be different from the original. Seen at [facility] ED 04/12/15 with normal EKG, troponin-2, CTA chest and discharged   Dyslipidemia 02/09/2016   Essential (primary) hypertension    GERD without esophagitis    History of pulmonary embolism 02/09/2016   Hyperlipidemia    Obesity    Panic disorder (episodic paroxysmal anxiety)    Tachycardia 02/09/2016   Type 2 diabetes mellitus without complication (HCC)    Vitamin D deficiency    Past Surgical History:  Procedure Laterality Date   CHOLECYSTECTOMY  2005   HERNIA REPAIR  2005   x2    Family History  Problem Relation Age of Onset   Leukemia Mother    Heart disease Mother        Pacemaker   Diabetes Father    Heart disease Father        Stents   Asthma Brother    Social History   Socioeconomic History   Marital status: Married    Spouse name: Amberly Eckmann   Number of children: 3   Years of education: Not on file   Highest education level: Some college, no degree  Occupational History   Not on file  Tobacco Use   Smoking status: Former    Current packs/day: 0.00    Types: Cigarettes    Start date: 11/2013    Quit date: 11/2018    Years since quitting: 4.1   Smokeless tobacco: Never  Vaping Use   Vaping status: Every Day  Substance and Sexual Activity   Alcohol use: Never   Drug use: Never   Sexual activity: Not Currently  Other Topics Concern   Not on file  Social History Narrative   Not on file   Social Determinants of Health   Financial Resource Strain: Medium Risk (01/08/2023)   Overall Financial Resource Strain (CARDIA)    Difficulty of Paying Living Expenses: Somewhat hard  Food Insecurity: Food  Insecurity Present (01/08/2023)   Hunger Vital Sign    Worried About Running Out of Food in the Last Year: Often true    Ran Out of Food in the Last Year: Often true  Transportation Needs: No Transportation Needs (01/08/2023)   PRAPARE - Administrator, Civil Service (Medical): No    Lack of Transportation (Non-Medical): No  Physical Activity: Insufficiently Active (01/08/2023)  Exercise Vital Sign    Days of Exercise per Week: 3 days    Minutes of Exercise per Session: 30 min  Stress: Stress Concern Present (01/08/2023)   [employer] of Occupational Health - Occupational Stress Questionnaire    Feeling of Stress : Very much  Social Connections: Socially Isolated (01/08/2023)   Social Connection and Isolation Panel [NHANES]    Frequency of Communication with Friends and Family: Never    Frequency of Social Gatherings with Friends and Family: Never    Attends Religious Services: Never    Diplomatic Services operational officer: No    Attends Engineer, structural: Never    Marital Status: Married    Objective:  BP 100/68   Pulse 65   Temp 99.5 F (37.5 C)   Resp 14   Ht 5\' 1"  (1.549 m)   Wt 185 lb (83.9 kg)   SpO2 97%   BMI 34.96 kg/m      01/09/2023    8:11 AM 10/03/2022    7:53 AM 08/24/2022    9:40 AM  BP/Weight  Systolic BP 100 116 120  Diastolic BP 68 70 66  Wt. (Lbs) 185 188 192  BMI 34.96 kg/m2 35.52 kg/m2 36.28 kg/m2    Physical Exam Vitals reviewed.  Constitutional:      Appearance: Normal appearance.  Neck:     Vascular: No carotid bruit.  Cardiovascular:     Rate and Rhythm: Normal rate and regular rhythm.     Heart sounds: Normal heart sounds.  Pulmonary:     Effort: Pulmonary effort is normal.     Breath sounds: Normal breath sounds.  Abdominal:     General: Bowel sounds are normal.     Palpations: Abdomen is soft.     Tenderness: There is no abdominal tenderness.  Neurological:     Mental Status: She is alert and oriented  to person, place, and time.  Psychiatric:        Mood and Affect: Mood normal.        Behavior: Behavior normal.     Diabetic Foot Exam - Simple   No data filed      Lab Results  Component Value Date   WBC 7.1 10/03/2022   HGB 12.8 10/03/2022   HCT 39.5 10/03/2022   PLT 302 10/03/2022   GLUCOSE 66 (L) 10/03/2022   CHOL 160 10/03/2022   TRIG 179 (H) 10/03/2022   HDL 41 10/03/2022   LDLCALC 88 10/03/2022   ALT 17 10/03/2022   AST 20 10/03/2022   NA 142 10/03/2022   K 4.1 10/03/2022   CL 105 10/03/2022   CREATININE 1.75 (H) 10/03/2022   BUN 20 10/03/2022   CO2 24 10/03/2022   TSH 1.070 02/23/2022   HGBA1C 5.6 10/03/2022      Assessment & Plan:    Essential (primary) hypertension Assessment & Plan: Well controlled.  Continue to work on eating a healthy diet and exercise.  Labs drawn today.   No major side effects reported, and no issues with compliance. The current medical regimen is effective;  continue present plan with Propanolol Will adjust medication as needed depending on labs BP Readings from Last 3 Encounters:  01/09/23 100/68  10/03/22 116/70  08/24/22 120/66     Orders: -     CBC with Differential/Platelet -     Comprehensive metabolic panel  GERD without esophagitis Assessment & Plan: Controlled Continue taking Pepcid as directed Will let us know if symptoms  change so we can make adjustments   Type 2 diabetes mellitus with stage 3b chronic kidney disease, without long-term current use of insulin (HCC) Assessment & Plan: Well controlled.  Continue to work on eating a healthy diet and exercise.  Labs drawn today.   No major side effects reported, and no issues with compliance. The current medical regimen is effective;  continue present plan with Farxiga 10mg , Ozempic .25mg  Will adjust medication as needed depending on labs Lab Results  Component Value Date   HGBA1C 5.6 10/03/2022   HGBA1C 6.0 (H) 06/01/2022   HGBA1C 5.9 (H) 02/23/2022       Orders: -     Hemoglobin A1c -     Ozempic (0.25 or 0.5 MG/DOSE); Inject 0.25 mg as directed once a week. INJECT 0.25MG  INTO THE SKIN ONE TIME PER WEEK  Dispense: 9 mL; Refill: 3  Dyslipidemia Assessment & Plan: Well controlled.  Continue to work on eating a healthy diet and exercise.  Labs drawn today.   No major side effects reported, and no issues with compliance. The current medical regimen is effective;  continue present plan with Atorvastatin 20mg  Will adjust medication as needed depending on labs Lab Results  Component Value Date   LDLCALC 88 10/03/2022     Orders: -     Lipid panel  Type 2 diabetes mellitus with hyperglycemia, without long-term current use of insulin (HCC) -     Dapagliflozin Propanediol; Take 1 tablet (10 mg total) by mouth daily before breakfast.  Dispense: 90 tablet; Refill: 3  Abnormal kidney function -     Dapagliflozin Propanediol; Take 1 tablet (10 mg total) by mouth daily before breakfast.  Dispense: 90 tablet; Refill: 3  Stage 3b chronic kidney disease (CKD) (HCC) -     Dapagliflozin Propanediol; Take 1 tablet (10 mg total) by mouth daily before breakfast.  Dispense: 90 tablet; Refill: 3  History of pulmonary embolism Assessment & Plan: Controlled Continue taking Xeralto 15mg  as directed  Orders: -     Rivaroxaban; Take 1 tablet (15 mg total) by mouth daily with supper.  Dispense: 90 tablet; Refill: 3  Screening for colorectal cancer -     Cologuard     Meds ordered this encounter  Medications   dapagliflozin propanediol (FARXIGA) 10 MG TABS tablet    Sig: Take 1 tablet (10 mg total) by mouth daily before breakfast.    Dispense:  90 tablet    Refill:  3   Semaglutide,0.25 or 0.5MG /DOS, (OZEMPIC, 0.25 OR 0.5 MG/DOSE,) 2 MG/3ML SOPN    Sig: Inject 0.25 mg as directed once a week. INJECT 0.25MG  INTO THE SKIN ONE TIME PER WEEK    Dispense:  9 mL    Refill:  3   Rivaroxaban (XARELTO) 15 MG TABS tablet    Sig: Take 1 tablet  (15 mg total) by mouth daily with supper.    Dispense:  90 tablet    Refill:  3    Orders Placed This Encounter  Procedures   CBC with Differential/Platelet   Comprehensive metabolic panel   Hemoglobin A1c   Lipid panel   Cologuard     Follow-up: Return in about 3 months (around 04/11/2023) for Chronic, Huston Foley.   I,Marla I Leal-Borjas,acting as a scribe for [employer], PA.,have documented all relevant documentation on the behalf of Langley Gauss, PA,as directed by  Langley Gauss, PA while in the presence of Langley Gauss, [place].   An After Visit Summary was printed and given  to the patient.  Langley Gauss, [place] [facility] [phone]

## 2023-01-09 ENCOUNTER — Encounter: Payer: Self-pay | Admitting: Physician Assistant

## 2023-01-09 ENCOUNTER — Ambulatory Visit: Payer: Medicare HMO | Admitting: Physician Assistant

## 2023-01-09 VITALS — BP 100/68 | HR 65 | Temp 99.5°F | Resp 14 | Ht 61.0 in | Wt 185.0 lb

## 2023-01-09 DIAGNOSIS — I10 Essential (primary) hypertension: Secondary | ICD-10-CM

## 2023-01-09 DIAGNOSIS — E1165 Type 2 diabetes mellitus with hyperglycemia: Secondary | ICD-10-CM | POA: Diagnosis not present

## 2023-01-09 DIAGNOSIS — K219 Gastro-esophageal reflux disease without esophagitis: Secondary | ICD-10-CM

## 2023-01-09 DIAGNOSIS — N289 Disorder of kidney and ureter, unspecified: Secondary | ICD-10-CM | POA: Diagnosis not present

## 2023-01-09 DIAGNOSIS — N1832 Chronic kidney disease, stage 3b: Secondary | ICD-10-CM

## 2023-01-09 DIAGNOSIS — Z86711 Personal history of pulmonary embolism: Secondary | ICD-10-CM

## 2023-01-09 DIAGNOSIS — E785 Hyperlipidemia, unspecified: Secondary | ICD-10-CM

## 2023-01-09 DIAGNOSIS — Z1211 Encounter for screening for malignant neoplasm of colon: Secondary | ICD-10-CM

## 2023-01-09 DIAGNOSIS — E1122 Type 2 diabetes mellitus with diabetic chronic kidney disease: Secondary | ICD-10-CM

## 2023-01-09 DIAGNOSIS — Z1212 Encounter for screening for malignant neoplasm of rectum: Secondary | ICD-10-CM | POA: Diagnosis not present

## 2023-01-09 MED ORDER — RIVAROXABAN 15 MG PO TABS: 15.0000 mg | ORAL_TABLET | Freq: Every day | ORAL | 3 refills | Status: DC

## 2023-01-09 MED ORDER — DAPAGLIFLOZIN PROPANEDIOL 10 MG PO TABS
10.0000 mg | ORAL_TABLET | Freq: Every day | ORAL | 3 refills | Status: DC
Start: 2023-01-09 — End: 2024-01-15

## 2023-01-09 MED ORDER — OZEMPIC (0.25 OR 0.5 MG/DOSE) 2 MG/3ML ~~LOC~~ SOPN: 0.2500 mg | PEN_INJECTOR | SUBCUTANEOUS | 3 refills | Status: DC

## 2023-01-09 NOTE — Assessment & Plan Note (Addendum)
Well controlled.  Continue to work on eating a healthy diet and exercise.  Labs drawn today.   No major side effects reported, and no issues with compliance. The current medical regimen is effective;  continue present plan with Propanolol Will adjust medication as needed depending on labs BP Readings from Last 3 Encounters:  01/09/23 100/68  10/03/22 116/70  08/24/22 120/66

## 2023-01-09 NOTE — Assessment & Plan Note (Signed)
Well controlled.  Continue to work on eating a healthy diet and exercise.  Labs drawn today.   No major side effects reported, and no issues with compliance. The current medical regimen is effective;  continue present plan with Farxiga 10mg , Ozempic .25mg  Will adjust medication as needed depending on labs Lab Results  Component Value Date   HGBA1C 5.6 10/03/2022   HGBA1C 6.0 (H) 06/01/2022   HGBA1C 5.9 (H) 02/23/2022

## 2023-01-09 NOTE — Assessment & Plan Note (Signed)
Well controlled.  Continue to work on eating a healthy diet and exercise.  Labs drawn today.   No major side effects reported, and no issues with compliance. The current medical regimen is effective;  continue present plan with Atorvastatin 20mg  Will adjust medication as needed depending on labs Lab Results  Component Value Date   LDLCALC 88 10/03/2022

## 2023-01-09 NOTE — Assessment & Plan Note (Signed)
Controlled Continue taking Pepcid as directed Will let us know if symptoms change so we can make adjustments

## 2023-01-09 NOTE — Assessment & Plan Note (Signed)
Controlled Continue taking Xeralto 15mg  as directed

## 2023-01-10 LAB — CBC WITH DIFFERENTIAL/PLATELET
Basophils Absolute: 0.1 10*3/uL (ref 0.0–0.2)
Basos: 1 %
EOS (ABSOLUTE): 0.2 10*3/uL (ref 0.0–0.4)
Eos: 3 %
Hematocrit: 35.8 % (ref 34.0–46.6)
Hemoglobin: 11.6 g/dL (ref 11.1–15.9)
Immature Grans (Abs): 0 10*3/uL (ref 0.0–0.1)
Immature Granulocytes: 0 %
Lymphocytes Absolute: 1.9 10*3/uL (ref 0.7–3.1)
Lymphs: 27 %
MCH: 30.2 pg (ref 26.6–33.0)
MCHC: 32.4 g/dL (ref 31.5–35.7)
MCV: 93 fL (ref 79–97)
Monocytes Absolute: 0.4 10*3/uL (ref 0.1–0.9)
Monocytes: 5 %
Neutrophils Absolute: 4.4 10*3/uL (ref 1.4–7.0)
Neutrophils: 64 %
Platelets: 296 10*3/uL (ref 150–450)
RBC: 3.84 x10E6/uL (ref 3.77–5.28)
RDW: 13.9 % (ref 11.7–15.4)
WBC: 6.9 10*3/uL (ref 3.4–10.8)

## 2023-01-10 LAB — COMPREHENSIVE METABOLIC PANEL
ALT: 17 IU/L (ref 0–32)
AST: 17 IU/L (ref 0–40)
Albumin: 4 g/dL (ref 3.9–4.9)
Alkaline Phosphatase: 106 IU/L (ref 44–121)
BUN/Creatinine Ratio: 11 — ABNORMAL LOW (ref 12–28)
BUN: 19 mg/dL (ref 8–27)
Bilirubin Total: <0.2 mg/dL (ref 0.0–1.2)
CO2: 23 mmol/L (ref 20–29)
Calcium: 9.4 mg/dL (ref 8.7–10.3)
Chloride: 107 mmol/L — ABNORMAL HIGH (ref 96–106)
Creatinine, Ser: 1.74 mg/dL — ABNORMAL HIGH (ref 0.57–1.00)
Globulin, Total: 2.3 g/dL (ref 1.5–4.5)
Glucose: 168 mg/dL — ABNORMAL HIGH (ref 70–99)
Potassium: 5 mmol/L (ref 3.5–5.2)
Sodium: 145 mmol/L — ABNORMAL HIGH (ref 134–144)
Total Protein: 6.3 g/dL (ref 6.0–8.5)
eGFR: 32 mL/min/{1.73_m2} — ABNORMAL LOW (ref >59)

## 2023-01-10 LAB — HEMOGLOBIN A1C
Est. average glucose Bld gHb Est-mCnc: 114 mg/dL
Hgb A1c MFr Bld: 5.6 % (ref 4.8–5.6)

## 2023-01-10 LAB — LITHOLINK CKD PROGRAM

## 2023-01-10 LAB — LIPID PANEL
Chol/HDL Ratio: 4.2 ratio (ref 0.0–4.4)
Cholesterol, Total: 169 mg/dL (ref 100–199)
HDL: 40 mg/dL (ref >39)
LDL Chol Calc (NIH): 97 mg/dL (ref 0–99)
Triglycerides: 188 mg/dL — ABNORMAL HIGH (ref 0–149)
VLDL Cholesterol Cal: 32 mg/dL (ref 5–40)

## 2023-02-01 DIAGNOSIS — F5101 Primary insomnia: Secondary | ICD-10-CM | POA: Diagnosis not present

## 2023-02-01 DIAGNOSIS — F3132 Bipolar disorder, current episode depressed, moderate: Secondary | ICD-10-CM | POA: Diagnosis not present

## 2023-02-01 DIAGNOSIS — F410 Panic disorder [episodic paroxysmal anxiety] without agoraphobia: Secondary | ICD-10-CM | POA: Diagnosis not present

## 2023-02-01 DIAGNOSIS — F411 Generalized anxiety disorder: Secondary | ICD-10-CM | POA: Diagnosis not present

## 2023-02-13 ENCOUNTER — Telehealth: Payer: Self-pay | Admitting: Physician Assistant

## 2023-02-13 NOTE — Telephone Encounter (Signed)
Pt called today to request a same day appointment for the following symptoms:URI.  Unfortunately, our schedule is full and we have no openings between today or tomorrow. Pt was notified that they can wait on a call from our triage or they can do an e-visit through MyChart with a [facility] Provider from home if they have the ability .

## 2023-03-16 DIAGNOSIS — N1832 Chronic kidney disease, stage 3b: Secondary | ICD-10-CM | POA: Diagnosis not present

## 2023-04-05 DIAGNOSIS — F411 Generalized anxiety disorder: Secondary | ICD-10-CM | POA: Diagnosis not present

## 2023-04-05 DIAGNOSIS — F410 Panic disorder [episodic paroxysmal anxiety] without agoraphobia: Secondary | ICD-10-CM | POA: Diagnosis not present

## 2023-04-05 DIAGNOSIS — F3132 Bipolar disorder, current episode depressed, moderate: Secondary | ICD-10-CM | POA: Diagnosis not present

## 2023-04-05 DIAGNOSIS — F5101 Primary insomnia: Secondary | ICD-10-CM | POA: Diagnosis not present

## 2023-04-13 ENCOUNTER — Ambulatory Visit: Payer: Medicare HMO | Admitting: Physician Assistant

## 2023-05-31 DIAGNOSIS — F5101 Primary insomnia: Secondary | ICD-10-CM | POA: Diagnosis not present

## 2023-05-31 DIAGNOSIS — F411 Generalized anxiety disorder: Secondary | ICD-10-CM | POA: Diagnosis not present

## 2023-05-31 DIAGNOSIS — F410 Panic disorder [episodic paroxysmal anxiety] without agoraphobia: Secondary | ICD-10-CM | POA: Diagnosis not present

## 2023-05-31 DIAGNOSIS — F3132 Bipolar disorder, current episode depressed, moderate: Secondary | ICD-10-CM | POA: Diagnosis not present

## 2023-06-14 DIAGNOSIS — F3132 Bipolar disorder, current episode depressed, moderate: Secondary | ICD-10-CM | POA: Diagnosis not present

## 2023-06-14 DIAGNOSIS — F411 Generalized anxiety disorder: Secondary | ICD-10-CM | POA: Diagnosis not present

## 2023-06-14 DIAGNOSIS — F5101 Primary insomnia: Secondary | ICD-10-CM | POA: Diagnosis not present

## 2023-06-14 DIAGNOSIS — F410 Panic disorder [episodic paroxysmal anxiety] without agoraphobia: Secondary | ICD-10-CM | POA: Diagnosis not present

## 2023-07-04 NOTE — Progress Notes (Deleted)
Subjective:  Patient ID: Rachel Russell, female    DOB: 1956-05-21  Age: 68 y.o. MRN: [id number]  Chief Complaint  Patient presents with   Medical Management of Chronic Issues    HPI  Diabetes:  Complications: Glucose checking: Glucose logs:130-150 Hypoglycemia: none Most recent A1C: 5.6 Current medications: Farxiga 10 mg daily, Ozempic 0.5 mg weekly Last Eye Exam: 01/07/2022 Foot checks: daily  Anxiety: Wellbutrin 150 mg, Paxil 40 mg, and lorazepam 0.5 mg  as needed.   GERD: Pepcid 20 mg daily.  Hyperlipidemia: Current medications: Atorvastatin 20 mg daily.   Hypertension: Complications:none    Diet: regular Exercise: Not able to do much with her knee pain   Pt is a former smoker, quit 2017. 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      01/09/2023    8:21 AM 10/03/2022    8:11 AM 07/14/2022    3:06 PM 02/23/2022   11:09 AM  Depression screen PHQ 2/9  Decreased Interest 2 3 3 3   Down, Depressed, Hopeless 3 3 3 3   PHQ - 2 Score 5 6 6 6   Altered sleeping 3 3 0 0  Tired, decreased energy 2 3 1 2   Change in appetite 3 3 2 3   Feeling bad or failure about yourself  3 3 3 3   Trouble concentrating 2 3 3 3   Moving slowly or fidgety/restless 3 0 0 3  Suicidal thoughts 0  0 0  PHQ-9 Score 21 21 15 20   Difficult doing work/chores Not difficult at all Somewhat difficult Somewhat difficult Very difficult        01/09/2023    8:21 AM  Fall Risk   Falls in the past year? 0  Number falls in past yr: 0  Injury with Fall? 0  Risk for fall due to : No Fall Risks  Follow up Education provided    Patient Care Team: Langley Gauss, PA as PCP - General (Physician Assistant) Pa, [facility] Dannielle Huh, MD as Consulting Physician (Orthopedic Surgery) Maxie Barb, MD as Consulting Physician (Nephrology)   Review of Systems  Constitutional: Negative.   HENT: Negative.    Eyes: Negative.   Respiratory: Negative.     Cardiovascular: Negative.   Gastrointestinal: Negative.   Endocrine: Negative.   Genitourinary: Negative.   Musculoskeletal: Negative.   Skin: Negative.   Allergic/Immunologic: Negative.   Neurological: Negative.   Hematological: Negative.   Psychiatric/Behavioral: Negative.      Current Outpatient Medications on File Prior to Visit  Medication Sig Dispense Refill   atorvastatin (LIPITOR) 20 MG tablet Take 1 tablet (20 mg total) by mouth daily. 90 tablet 3   buPROPion (WELLBUTRIN XL) 150 MG 24 hr tablet Take 150 mg by mouth every morning.     dapagliflozin propanediol (FARXIGA) 10 MG TABS tablet Take 1 tablet (10 mg total) by mouth daily before breakfast. 90 tablet 3   famotidine (PEPCID) 20 MG tablet Take 1 tablet by mouth daily.     fexofenadine (ALLEGRA) 180 MG tablet Take 1 tablet (180 mg total) by mouth daily. 30 tablet 5   lamoTRIgine (LAMICTAL) 100 MG tablet Take 300 mg by mouth daily. 1 in am and 2 in pm     LORazepam (ATIVAN) 0.5 MG tablet Take 0.5 mg by mouth 2 (two) times daily as needed for anxiety.     PARoxetine (PAXIL) 40 MG tablet Take 40 mg by mouth at bedtime.     propranolol ER (INDERAL LA) 60  MG 24 hr capsule Take 60 mg by mouth daily.     Rivaroxaban (XARELTO) 15 MG TABS tablet Take 1 tablet (15 mg total) by mouth daily with supper. 90 tablet 3   Semaglutide,0.25 or 0.5MG /DOS, (OZEMPIC, 0.25 OR 0.5 MG/DOSE,) 2 MG/3ML SOPN Inject 0.25 mg as directed once a week. INJECT 0.25MG  INTO THE SKIN ONE TIME PER WEEK 9 mL 3   traZODone (DESYREL) 150 MG tablet Take 150 mg by mouth at bedtime.     No current facility-administered medications on file prior to visit.   Past Medical History:  Diagnosis Date   Allergic rhinitis    Anxiety disorder    Bipolar disorder (HCC)    Chest pain 05/31/2015   Formatting of this note might be different from the original. Seen at [facility] ED 04/12/15 with normal EKG, troponin-2, CTA chest and discharged   Dyslipidemia 02/09/2016   Essential  (primary) hypertension    GERD without esophagitis    History of pulmonary embolism 02/09/2016   Hyperlipidemia    Obesity    Panic disorder (episodic paroxysmal anxiety)    Tachycardia 02/09/2016   Type 2 diabetes mellitus without complication (HCC)    Vitamin D deficiency    Past Surgical History:  Procedure Laterality Date   CHOLECYSTECTOMY  2005   HERNIA REPAIR  2005   x2    Family History  Problem Relation Age of Onset   Leukemia Mother    Heart disease Mother        Pacemaker   Diabetes Father    Heart disease Father        Stents   Asthma Brother    Social History   Socioeconomic History   Marital status: Married    Spouse name: Sheria Rosello   Number of children: 3   Years of education: Not on file   Highest education level: Some college, no degree  Occupational History   Not on file  Tobacco Use   Smoking status: Former    Current packs/day: 0.00    Types: Cigarettes    Start date: 11/2013    Quit date: 11/2018    Years since quitting: 4.6   Smokeless tobacco: Never  Vaping Use   Vaping status: Every Day  Substance and Sexual Activity   Alcohol use: Never   Drug use: Never   Sexual activity: Not Currently  Other Topics Concern   Not on file  Social History Narrative   Not on file   Social Drivers of Health   Financial Resource Strain: Medium Risk (01/08/2023)   Overall Financial Resource Strain (CARDIA)    Difficulty of Paying Living Expenses: Somewhat hard  Food Insecurity: Food Insecurity Present (01/08/2023)   Hunger Vital Sign    Worried About Running Out of Food in the Last Year: Often true    Ran Out of Food in the Last Year: Often true  Transportation Needs: No Transportation Needs (01/08/2023)   PRAPARE - Administrator, Civil Service (Medical): No    Lack of Transportation (Non-Medical): No  Physical Activity: Insufficiently Active (01/08/2023)   Exercise Vital Sign    Days of Exercise per Week: 3 days    Minutes of  Exercise per Session: 30 min  Stress: Stress Concern Present (01/08/2023)   [employer] of Occupational Health - Occupational Stress Questionnaire    Feeling of Stress : Very much  Social Connections: Socially Isolated (01/08/2023)   Social Connection and Isolation Panel [NHANES]    Frequency  of Communication with Friends and Family: Never    Frequency of Social Gatherings with Friends and Family: Never    Attends Religious Services: Never    Database administrator or Organizations: No    Attends Banker Meetings: Never    Marital Status: Married    Objective:  There were no vitals taken for this visit.     01/09/2023    8:11 AM 10/03/2022    7:53 AM 08/24/2022    9:40 AM  BP/Weight  Systolic BP 100 116 120  Diastolic BP 68 70 66  Wt. (Lbs) 185 188 192  BMI 34.96 kg/m2 35.52 kg/m2 36.28 kg/m2    Physical Exam  Diabetic Foot Exam - Simple   No data filed      Lab Results  Component Value Date   WBC 6.9 01/09/2023   HGB 11.6 01/09/2023   HCT 35.8 01/09/2023   PLT 296 01/09/2023   GLUCOSE 168 (H) 01/09/2023   CHOL 169 01/09/2023   TRIG 188 (H) 01/09/2023   HDL 40 01/09/2023   LDLCALC 97 01/09/2023   ALT 17 01/09/2023   AST 17 01/09/2023   NA 145 (H) 01/09/2023   K 5.0 01/09/2023   CL 107 (H) 01/09/2023   CREATININE 1.74 (H) 01/09/2023   BUN 19 01/09/2023   CO2 23 01/09/2023   TSH 1.070 02/23/2022   HGBA1C 5.6 01/09/2023      Assessment & Plan:    There are no diagnoses linked to this encounter.   No orders of the defined types were placed in this encounter.   No orders of the defined types were placed in this encounter.    Follow-up: No follow-ups on file.   I,Barton Want,acting as a Neurosurgeon for [employer], PA.,have documented all relevant documentation on the behalf of Langley Gauss, PA,as directed by  Langley Gauss, PA while in the presence of Langley Gauss, [place].   An After Visit Summary was printed and given to the patient.  Langley Gauss, [place] [facility] [phone]

## 2023-07-05 ENCOUNTER — Encounter: Payer: Self-pay | Admitting: Physician Assistant

## 2023-07-05 ENCOUNTER — Encounter: Payer: Medicare HMO | Admitting: Physician Assistant

## 2023-07-09 NOTE — Progress Notes (Signed)
 This encounter was created in error - please disregard.

## 2023-07-12 DIAGNOSIS — F410 Panic disorder [episodic paroxysmal anxiety] without agoraphobia: Secondary | ICD-10-CM | POA: Diagnosis not present

## 2023-07-12 DIAGNOSIS — F3132 Bipolar disorder, current episode depressed, moderate: Secondary | ICD-10-CM | POA: Diagnosis not present

## 2023-07-12 DIAGNOSIS — F411 Generalized anxiety disorder: Secondary | ICD-10-CM | POA: Diagnosis not present

## 2023-07-12 DIAGNOSIS — F5101 Primary insomnia: Secondary | ICD-10-CM | POA: Diagnosis not present

## 2023-08-09 DIAGNOSIS — F411 Generalized anxiety disorder: Secondary | ICD-10-CM | POA: Diagnosis not present

## 2023-08-09 DIAGNOSIS — F5101 Primary insomnia: Secondary | ICD-10-CM | POA: Diagnosis not present

## 2023-08-09 DIAGNOSIS — F3132 Bipolar disorder, current episode depressed, moderate: Secondary | ICD-10-CM | POA: Diagnosis not present

## 2023-08-09 DIAGNOSIS — F410 Panic disorder [episodic paroxysmal anxiety] without agoraphobia: Secondary | ICD-10-CM | POA: Diagnosis not present

## 2023-08-15 DIAGNOSIS — H5213 Myopia, bilateral: Secondary | ICD-10-CM | POA: Diagnosis not present

## 2023-08-15 DIAGNOSIS — H2513 Age-related nuclear cataract, bilateral: Secondary | ICD-10-CM | POA: Diagnosis not present

## 2023-08-15 DIAGNOSIS — H3562 Retinal hemorrhage, left eye: Secondary | ICD-10-CM | POA: Diagnosis not present

## 2023-08-15 DIAGNOSIS — H524 Presbyopia: Secondary | ICD-10-CM | POA: Diagnosis not present

## 2023-08-16 ENCOUNTER — Other Ambulatory Visit: Payer: Self-pay | Admitting: Cardiology

## 2023-08-21 DIAGNOSIS — N189 Chronic kidney disease, unspecified: Secondary | ICD-10-CM | POA: Diagnosis not present

## 2023-08-21 DIAGNOSIS — N1832 Chronic kidney disease, stage 3b: Secondary | ICD-10-CM | POA: Diagnosis not present

## 2023-08-21 DIAGNOSIS — E559 Vitamin D deficiency, unspecified: Secondary | ICD-10-CM | POA: Diagnosis not present

## 2023-08-21 DIAGNOSIS — I129 Hypertensive chronic kidney disease with stage 1 through stage 4 chronic kidney disease, or unspecified chronic kidney disease: Secondary | ICD-10-CM | POA: Diagnosis not present

## 2023-08-21 DIAGNOSIS — D631 Anemia in chronic kidney disease: Secondary | ICD-10-CM | POA: Diagnosis not present

## 2023-08-21 DIAGNOSIS — E1122 Type 2 diabetes mellitus with diabetic chronic kidney disease: Secondary | ICD-10-CM | POA: Diagnosis not present

## 2023-08-29 ENCOUNTER — Other Ambulatory Visit: Payer: Self-pay | Admitting: Cardiology

## 2023-08-31 ENCOUNTER — Ambulatory Visit

## 2023-09-19 ENCOUNTER — Ambulatory Visit: Attending: Cardiology | Admitting: Cardiology

## 2023-09-19 ENCOUNTER — Encounter: Payer: Self-pay | Admitting: Cardiology

## 2023-09-19 VITALS — BP 110/66 | HR 75 | Ht 61.0 in | Wt 165.6 lb

## 2023-09-19 DIAGNOSIS — I10 Essential (primary) hypertension: Secondary | ICD-10-CM | POA: Diagnosis not present

## 2023-09-19 DIAGNOSIS — E785 Hyperlipidemia, unspecified: Secondary | ICD-10-CM

## 2023-09-19 DIAGNOSIS — E1122 Type 2 diabetes mellitus with diabetic chronic kidney disease: Secondary | ICD-10-CM | POA: Diagnosis not present

## 2023-09-19 DIAGNOSIS — N1832 Chronic kidney disease, stage 3b: Secondary | ICD-10-CM | POA: Diagnosis not present

## 2023-09-19 DIAGNOSIS — Z86711 Personal history of pulmonary embolism: Secondary | ICD-10-CM | POA: Diagnosis not present

## 2023-09-19 DIAGNOSIS — K219 Gastro-esophageal reflux disease without esophagitis: Secondary | ICD-10-CM

## 2023-09-19 DIAGNOSIS — F419 Anxiety disorder, unspecified: Secondary | ICD-10-CM

## 2023-09-19 NOTE — Progress Notes (Signed)
 Cardiology Office Note:    Date:  09/19/2023   ID:  Rachel Russell, DOB 1956/01/14, MRN [id number]  PCP:  Odilia Bennett, PA  Cardiologist:  Ralene Burger, MD    Referring MD: Odilia Bennett, [place]   Chief Complaint  Patient presents with   Follow-up    History of Present Illness:    Rachel Russell is a 68 y.o. female past medical history significant for unprovoked pulmonary emboli, with Xarelto  15 mg dose is appropriate.  Kidney function/heart, essential hypertension, dyslipidemia, diabetes, anxiety. Comes today 2 months for follow-up.  Overall doing well.  She denies any chest pain tightness squeezing pressure burning chest no palpitation dizziness her extremities are regular and doing well she did have knee surgery done very happy and satisfied results of the surgery  Past Medical History:  Diagnosis Date   Allergic rhinitis    Anxiety disorder    Bipolar disorder (HCC)    Chest pain 05/31/2015   Formatting of this note might be different from the original. Seen at [facility] ED 04/12/15 with normal EKG, troponin-2, CTA chest and discharged   Dyslipidemia 02/09/2016   Essential (primary) hypertension    GERD without esophagitis    History of pulmonary embolism 02/09/2016   Hyperlipidemia    Obesity    Panic disorder (episodic paroxysmal anxiety)    Tachycardia 02/09/2016   Type 2 diabetes mellitus without complication (HCC)    Vitamin D  deficiency     Past Surgical History:  Procedure Laterality Date   CHOLECYSTECTOMY  2005   HERNIA REPAIR  2005   x2    Current Medications: Current Meds  Medication Sig   atorvastatin  (LIPITOR) 20 MG tablet Take 1 tablet (20 mg total) by mouth daily. 1rst attempt, patient needs an appt for additional refills   dapagliflozin  propanediol (FARXIGA ) 10 MG TABS tablet Take 1 tablet (10 mg total) by mouth daily before breakfast.   famotidine (PEPCID) 20 MG tablet Take 1 tablet by mouth daily.   fexofenadine  (ALLEGRA ) 180 MG tablet Take 1 tablet  (180 mg total) by mouth daily.   lamoTRIgine (LAMICTAL) 100 MG tablet Take 300 mg by mouth daily. 1 in am and 2 in pm   LORazepam (ATIVAN) 0.5 MG tablet Take 0.5 mg by mouth 2 (two) times daily as needed for anxiety.   PARoxetine (PAXIL) 40 MG tablet Take 40 mg by mouth at bedtime.   propranolol ER (INDERAL LA) 60 MG 24 hr capsule Take 60 mg by mouth daily.   Rivaroxaban  (XARELTO ) 15 MG TABS tablet Take 1 tablet (15 mg total) by mouth daily with supper.   Semaglutide ,0.25 or 0.5MG /DOS, (OZEMPIC , 0.25 OR 0.5 MG/DOSE,) 2 MG/3ML SOPN Inject 0.25 mg as directed once a week. INJECT 0.25MG  INTO THE SKIN ONE TIME PER WEEK   traZODone (DESYREL) 150 MG tablet Take 150 mg by mouth at bedtime.     Allergies:   Iodinated contrast media, Penicillin g, Shellfish-derived products, Codeine, Latex, and Penicillins   Social History   Socioeconomic History   Marital status: Married    Spouse name: Nestora Innamorato   Number of children: 3   Years of education: Not on file   Highest education level: Some college, no degree  Occupational History   Not on file  Tobacco Use   Smoking status: Former    Current packs/day: 0.00    Types: Cigarettes    Start date: 11/2013    Quit date: 11/2018    Years since quitting: 4.8   Smokeless  tobacco: Never  Vaping Use   Vaping status: Every Day  Substance and Sexual Activity   Alcohol use: Never   Drug use: Never   Sexual activity: Not Currently  Other Topics Concern   Not on file  Social History Narrative   Not on file   Social Drivers of Health   Financial Resource Strain: Medium Risk (08/27/2023)   Overall Financial Resource Strain (CARDIA)    Difficulty of Paying Living Expenses: Somewhat hard  Food Insecurity: Food Insecurity Present (08/27/2023)   Hunger Vital Sign    Worried About Running Out of Food in the Last Year: Often true    Ran Out of Food in the Last Year: Often true  Transportation Needs: No Transportation Needs (08/27/2023)   PRAPARE -  Administrator, Civil Service (Medical): No    Lack of Transportation (Non-Medical): No  Physical Activity: Insufficiently Active (08/27/2023)   Exercise Vital Sign    Days of Exercise per Week: 3 days    Minutes of Exercise per Session: 30 min  Stress: Stress Concern Present (08/27/2023)   [employer] of Occupational Health - Occupational Stress Questionnaire    Feeling of Stress : Very much  Social Connections: Moderately Isolated (08/27/2023)   Social Connection and Isolation Panel [NHANES]    Frequency of Communication with Friends and Family: More than three times a week    Frequency of Social Gatherings with Friends and Family: More than three times a week    Attends Religious Services: 1 to 4 times per year    Active Member of [employer] or Organizations: No    Attends Banker Meetings: Not on file    Marital Status: Widowed     Family History: The patient's family history includes Asthma in her brother; Diabetes in her father; Heart disease in her father and mother; Leukemia in her mother. ROS:   Please see the history of present illness.    All 14 point review of systems negative except as described per history of present illness  EKGs/Labs/Other Studies Reviewed:    EKG Interpretation Date/Time:  Tuesday September 19 2023 16:21:05 EDT Ventricular Rate:  71 PR Interval:  202 QRS Duration:  82 QT Interval:  374 QTC Calculation: 406 R Axis:   51  Text Interpretation: Normal sinus rhythm Normal ECG No previous ECGs available Confirmed by Ralene Burger [phone]) on 09/19/2023 4:25:13 PM    Recent Labs: 01/09/2023: ALT 17; BUN 19; Creatinine, Ser 1.74; Hemoglobin 11.6; Platelets 296; Potassium 5.0; Sodium 145  Recent Lipid Panel    Component Value Date/Time   CHOL 169 01/09/2023 0849   TRIG 188 (H) 01/09/2023 0849   HDL 40 01/09/2023 0849   CHOLHDL 4.2 01/09/2023 0849   LDLCALC 97 01/09/2023 0849    Physical Exam:    VS:  BP 110/66 (BP  Location: Right Arm, Patient Position: Sitting)   Pulse 75   Ht 5\' 1"  (1.549 m)   Wt 165 lb 9.6 oz (75.1 kg)   SpO2 94%   BMI 31.29 kg/m     Wt Readings from Last 3 Encounters:  09/19/23 165 lb 9.6 oz (75.1 kg)  01/09/23 185 lb (83.9 kg)  10/03/22 188 lb (85.3 kg)     GEN:  Well nourished, well developed in no acute distress HEENT: Normal NECK: No JVD; No carotid bruits LYMPHATICS: No lymphadenopathy CARDIAC: RRR, no murmurs, no rubs, no gallops RESPIRATORY:  Clear to auscultation without rales, wheezing or rhonchi  ABDOMEN: Soft,  non-tender, non-distended MUSCULOSKELETAL:  No edema; No deformity  SKIN: Warm and dry LOWER EXTREMITIES: no swelling NEUROLOGIC:  Alert and oriented x 3 PSYCHIATRIC:  Normal affect   ASSESSMENT:    1. Essential (primary) hypertension   2. Type 2 diabetes mellitus with stage 3b chronic kidney disease, without long-term current use of insulin (HCC)   3. GERD without esophagitis   4. History of pulmonary embolism   5. Dyslipidemia   6. Anxiety disorder, unspecified type    PLAN:    In order of problems listed above:  Essential hypertension blood pressure well-controlled continue present management. Type 2 diabetes stable followed by internal medicine team I did review lab work test from [facility] her data from 01/09/2023 show hemoglobin A1c 5.6 which is good control. Dyslipidemia I did review K PN showing LDL 97 HDL 40 we will continue present management she is taking Lipitor 20 which I will continue for now history of PE anticoagulated which we will continue   Medication Adjustments/Labs and Tests Ordered: Current medicines are reviewed at length with the patient today.  Concerns regarding medicines are outlined above.  Orders Placed This Encounter  Procedures   EKG 12-Lead   Medication changes: No orders of the defined types were placed in this encounter.   Signed, Manfred Seed, MD, [facility] 09/19/2023 4:34 PM    [facility]

## 2023-09-19 NOTE — Patient Instructions (Signed)

## 2023-09-21 ENCOUNTER — Other Ambulatory Visit: Payer: Self-pay | Admitting: Cardiology

## 2023-09-21 NOTE — Telephone Encounter (Signed)
 Rx refill sent to pharmacy.

## 2023-09-28 ENCOUNTER — Encounter (HOSPITAL_COMMUNITY): Payer: Self-pay

## 2023-10-04 DIAGNOSIS — F411 Generalized anxiety disorder: Secondary | ICD-10-CM | POA: Diagnosis not present

## 2023-10-04 DIAGNOSIS — F410 Panic disorder [episodic paroxysmal anxiety] without agoraphobia: Secondary | ICD-10-CM | POA: Diagnosis not present

## 2023-10-04 DIAGNOSIS — F5101 Primary insomnia: Secondary | ICD-10-CM | POA: Diagnosis not present

## 2023-10-04 DIAGNOSIS — F3132 Bipolar disorder, current episode depressed, moderate: Secondary | ICD-10-CM | POA: Diagnosis not present

## 2023-10-05 ENCOUNTER — Ambulatory Visit

## 2023-10-05 VITALS — Ht 61.0 in | Wt 165.0 lb

## 2023-10-05 DIAGNOSIS — Z0000 Encounter for general adult medical examination without abnormal findings: Secondary | ICD-10-CM

## 2023-10-05 DIAGNOSIS — Z780 Asymptomatic menopausal state: Secondary | ICD-10-CM

## 2023-10-05 DIAGNOSIS — Z1231 Encounter for screening mammogram for malignant neoplasm of breast: Secondary | ICD-10-CM

## 2023-10-05 DIAGNOSIS — Z1211 Encounter for screening for malignant neoplasm of colon: Secondary | ICD-10-CM

## 2023-10-05 NOTE — Progress Notes (Addendum)
 Subjective:   Rachel Russell is a 68 y.o. who presents for a Medicare Wellness preventive visit.  As a reminder, Annual Wellness Visits don't include a physical exam, and some assessments may be limited, especially if this visit is performed virtually. We may recommend an in-person visit if needed.  Visit Complete: Virtual I connected with  Rachel Russell on 10/05/23 by a audio enabled telemedicine application and verified that I am speaking with the correct person using two identifiers.  Patient Location: Home  Provider Location: Home Office  I discussed the limitations of evaluation and management by telemedicine. The patient expressed understanding and agreed to proceed.  Vital Signs: Because this visit was a virtual/telehealth visit, some criteria may be missing or patient reported. Any vitals not documented were not able to be obtained and vitals that have been documented are patient reported.  VideoDeclined- This patient declined Librarian, academic. Therefore the visit was completed with audio only.  Persons Participating in Visit: Patient.  AWV Questionnaire: Yes: Patient Medicare AWV questionnaire was completed by the patient on 10/05/23 ; I have confirmed that all information answered by patient is correct and no changes since this date.  Cardiac Risk Factors include: advanced age (>29men, >22 women);diabetes mellitus;smoking/ tobacco exposure;dyslipidemia     Objective:     Today's Vitals   10/05/23 0835  Weight: 165 lb (74.8 kg)  Height: 5\' 1"  (1.549 m)   Body mass index is 31.18 kg/m.     10/05/2023   10:24 AM  Advanced Directives  Does Patient Have a Medical Advance Directive? No  Would patient like information on creating a medical advance directive? Yes (MAU/Ambulatory/Procedural Areas - Information given)    Current Medications (verified) Outpatient Encounter Medications as of 10/05/2023  Medication Sig   atorvastatin   (LIPITOR) 20 MG tablet Take 1 tablet (20 mg total) by mouth daily.   dapagliflozin  propanediol (FARXIGA ) 10 MG TABS tablet Take 1 tablet (10 mg total) by mouth daily before breakfast.   famotidine (PEPCID) 20 MG tablet Take 1 tablet by mouth daily.   fexofenadine  (ALLEGRA ) 180 MG tablet Take 1 tablet (180 mg total) by mouth daily.   lamoTRIgine (LAMICTAL) 100 MG tablet Take 300 mg by mouth daily. 1 in am and 2 in pm   LORazepam (ATIVAN) 0.5 MG tablet Take 0.5 mg by mouth 2 (two) times daily as needed for anxiety.   PARoxetine (PAXIL) 40 MG tablet Take 40 mg by mouth at bedtime.   propranolol ER (INDERAL LA) 60 MG 24 hr capsule Take 60 mg by mouth daily.   Rivaroxaban  (XARELTO ) 15 MG TABS tablet Take 1 tablet (15 mg total) by mouth daily with supper.   Semaglutide ,0.25 or 0.5MG /DOS, (OZEMPIC , 0.25 OR 0.5 MG/DOSE,) 2 MG/3ML SOPN Inject 0.25 mg as directed once a week. INJECT 0.25MG  INTO THE SKIN ONE TIME PER WEEK   traZODone (DESYREL) 150 MG tablet Take 150 mg by mouth at bedtime.   No facility-administered encounter medications on file as of 10/05/2023.    Allergies (verified) Iodinated contrast media, Penicillin g, Shellfish-derived products, Codeine, Latex, and Penicillins   History: Past Medical History:  Diagnosis Date   Allergic rhinitis    Allergy 1975   Anemia June 2023   Anxiety disorder    Arthritis Don't know   Bipolar disorder (HCC)    Chest pain 05/31/2015   Formatting of this note might be different from the original. Seen at [facility] ED 04/12/15 with normal EKG, troponin-2, CTA chest and  discharged   Chronic kidney disease 2023   Clotting disorder [facility]) Not sure   Depression Don't remember   Dyslipidemia 02/09/2016   Essential (primary) hypertension    GERD without esophagitis    History of pulmonary embolism 02/09/2016   Hyperlipidemia    Obesity    Panic disorder (episodic paroxysmal anxiety)    Tachycardia 02/09/2016   Type 2 diabetes mellitus without complication  (HCC)    Vitamin D  deficiency    Past Surgical History:  Procedure Laterality Date   CHOLECYSTECTOMY  2005   HERNIA REPAIR  2005   x2   JOINT REPLACEMENT  06/2022   Family History  Problem Relation Age of Onset   Leukemia Mother    Heart disease Mother        Pacemaker   Arthritis Mother    Cancer Mother    Hearing loss Mother    Hypertension Mother    Vision loss Mother    Diabetes Father    Heart disease Father        Stents   Hyperlipidemia Father    Varicose Veins Father    Asthma Brother    Early death Paternal Grandmother    Heart disease Paternal Grandmother    Anxiety disorder Maternal Aunt    Depression Maternal Aunt    Asthma Brother    Early death Brother    Early death Son    Miscarriages / [place] Son    Hyperlipidemia Daughter    Anxiety disorder Maternal Aunt    Depression Maternal Aunt    Asthma Brother    Early death Brother    Early death Son    Miscarriages / [place] Son    Hyperlipidemia Daughter    Social History   Socioeconomic History   Marital status: Married    Spouse name: Jocellyn Zepeda   Number of children: 3   Years of education: Not on file   Highest education level: Some college, no degree  Occupational History   Not on file  Tobacco Use   Smoking status: Former    Current packs/day: 0.00    Average packs/day: 0.3 packs/day for 15.0 years (3.8 ttl pk-yrs)    Types: Cigarettes, Cigars    Start date: 11/2013    Quit date: 11/2018    Years since quitting: 4.8   Smokeless tobacco: Never  Vaping Use   Vaping status: Every Day  Substance and Sexual Activity   Alcohol use: Never   Drug use: Never   Sexual activity: Not Currently    Birth control/protection: None  Other Topics Concern   Not on file  Social History Narrative   Not on file   Social Drivers of Health   Financial Resource Strain: Medium Risk (10/05/2023)   Overall Financial Resource Strain (CARDIA)    Difficulty of Paying Living Expenses: Somewhat  hard  Food Insecurity: Food Insecurity Present (10/05/2023)   Hunger Vital Sign    Worried About Running Out of Food in the Last Year: Often true    Ran Out of Food in the Last Year: Often true  Transportation Needs: No Transportation Needs (10/05/2023)   PRAPARE - Administrator, Civil Service (Medical): No    Lack of Transportation (Non-Medical): No  Physical Activity: Insufficiently Active (10/05/2023)   Exercise Vital Sign    Days of Exercise per Week: 3 days    Minutes of Exercise per Session: 30 min  Stress: Stress Concern Present (10/05/2023)   [employer] of Occupational  Health - Occupational Stress Questionnaire    Feeling of Stress : Very much  Social Connections: Moderately Isolated (10/05/2023)   Social Connection and Isolation Panel [NHANES]    Frequency of Communication with Friends and Family: More than three times a week    Frequency of Social Gatherings with Friends and Family: More than three times a week    Attends Religious Services: 1 to 4 times per year    Active Member of [employer] or Organizations: No    Attends Banker Meetings: Never    Marital Status: Widowed    Tobacco Counseling Counseling given: Not Answered    Clinical Intake:  Pre-visit preparation completed: Yes  Pain : No/denies pain     Diabetes: Yes CBG done?: No Did pt. bring in CBG monitor from home?: No  Lab Results  Component Value Date   HGBA1C 5.6 01/09/2023   HGBA1C 5.6 10/03/2022   HGBA1C 6.0 (H) 06/01/2022     How often do you need to have someone help you when you read instructions, pamphlets, or other written materials from your doctor or pharmacy?: 1 - Never  Interpreter Needed?: No  Information entered by :: Seabron Cypress LPN   Activities of Daily Living     10/05/2023    7:50 AM  In your present state of health, do you have any difficulty performing the following activities:  Hearing? 0  Vision? 0  Difficulty concentrating or making  decisions? 0  Walking or climbing stairs? 0  Dressing or bathing? 0  Doing errands, shopping? 0  Preparing Food and eating ? N  Using the Toilet? N  In the past six months, have you accidently leaked urine? N  Do you have problems with loss of bowel control? N  Managing your Medications? N  Managing your Finances? N  Housekeeping or managing your Housekeeping? N    Patient Care Team: Odilia Bennett, [place] as PCP - General (Physician Assistant) Pa, [facility] Christie Cox, MD as Consulting Physician (Orthopedic Surgery) Clementine Cutting, MD as Consulting Physician (Nephrology) Gillie Lacy, OD (Optometry)  Indicate any recent Medical Services you may have received from other than [facility] providers in the past year (date may be approximate).     Assessment:    This is a routine wellness examination for Rachel Russell.  Hearing/Vision screen Hearing Screening - Comments:: Denies hearing difficulties   Vision Screening - Comments:: Wears rx glasses - up to date with routine eye exams with [facility]    Goals Addressed             This Visit's Progress    Remain active and independent         Depression Screen     10/05/2023   10:22 AM 01/09/2023    8:21 AM 10/03/2022    8:11 AM 07/14/2022    3:06 PM 02/23/2022   11:09 AM  PHQ 2/9 Scores  PHQ - 2 Score 4 5 6 6 6   PHQ- 9 Score 14 21 21 15 20     Fall Risk     10/05/2023    7:50 AM 01/09/2023    8:21 AM 10/03/2022    8:11 AM 07/10/2022    2:37 PM 02/23/2022   11:06 AM  Fall Risk   Falls in the past year? 0 0 0 0 0  Number falls in past yr: 0 0 0 1 0  Injury with Fall? 0 0 0 0 0  Risk for fall due  to : No Fall Risks No Fall Risks No Fall Risks Impaired balance/gait Impaired balance/gait;Other (Comment)  Risk for fall due to: Comment     Right knee pain  Follow up Falls prevention discussed;Education provided;Falls evaluation completed Education provided Falls evaluation completed Falls evaluation  completed;Education provided Falls evaluation completed    MEDICARE RISK AT HOME:  Medicare Risk at Home Any stairs in or around the home?: (Patient-Rptd) Yes If so, are there any without handrails?: (Patient-Rptd) No Home free of loose throw rugs in walkways, pet beds, electrical cords, etc?: (Patient-Rptd) Yes Adequate lighting in your home to reduce risk of falls?: (Patient-Rptd) Yes Life alert?: (Patient-Rptd) No Use of a cane, walker or w/c?: (Patient-Rptd) No Grab bars in the bathroom?: (Patient-Rptd) No Shower chair or bench in shower?: (Patient-Rptd) No Elevated toilet seat or a handicapped toilet?: (Patient-Rptd) No  TIMED UP AND GO:  Was the test performed?  No  Cognitive Function: 6CIT completed        10/05/2023   10:24 AM 07/14/2022    3:30 PM  6CIT Screen  What Year? 0 points 0 points  What month? 0 points 0 points  What time? 0 points 0 points  Count back from 20 0 points 0 points  Months in reverse 0 points 0 points  Repeat phrase 0 points 0 points  Total Score 0 points 0 points    Immunizations Immunization History  Administered Date(s) Administered   Influenza-Unspecified 02/07/2022   MMR 09/24/2002   Moderna Sars-Covid-2 Vaccination 09/11/2019, 10/09/2019   Td 09/10/2002    Screening Tests Health Maintenance  Topic Date Due   Pneumonia Vaccine 24+ Years old (1 of 2 - PCV) Never done   Fecal DNA (Cologuard)  Never done   MAMMOGRAM  Never done   Zoster Vaccines- Shingrix (1 of 2) Never done   DTaP/Tdap/Td (2 - Tdap) 09/09/2012   DEXA SCAN  Never done   OPHTHALMOLOGY EXAM  01/08/2023   Diabetic kidney evaluation - Urine ACR  06/02/2023   FOOT EXAM  06/02/2023   HEMOGLOBIN A1C  07/12/2023   INFLUENZA VACCINE  12/22/2023   Diabetic kidney evaluation - eGFR measurement  01/09/2024   Medicare Annual Wellness (AWV)  10/04/2024   HPV VACCINES  Aged Out   Meningococcal B Vaccine  Aged Out   COVID-19 Vaccine  Discontinued    Health  Maintenance  Health Maintenance Due  Topic Date Due   Pneumonia Vaccine 69+ Years old (1 of 2 - PCV) Never done   Fecal DNA (Cologuard)  Never done   MAMMOGRAM  Never done   Zoster Vaccines- Shingrix (1 of 2) Never done   DTaP/Tdap/Td (2 - Tdap) 09/09/2012   DEXA SCAN  Never done   OPHTHALMOLOGY EXAM  01/08/2023   Diabetic kidney evaluation - Urine ACR  06/02/2023   FOOT EXAM  06/02/2023   HEMOGLOBIN A1C  07/12/2023   Health Maintenance Items Addressed: Mammogram ordered, DEXA ordered, Cologuard Ordered  Additional Screening:  Vision Screening: Recommended annual ophthalmology exams for early detection of glaucoma and other disorders of the eye.  Dental Screening: Recommended annual dental exams for proper oral hygiene  Community Resource Referral / Chronic Care Management: CRR required this visit?  No   CCM required this visit?  No   Plan:    I have personally reviewed and noted the following in the patient's chart:   Medical and social history Use of alcohol, tobacco or illicit drugs  Current medications and supplements including opioid prescriptions.  Patient is not currently taking opioid prescriptions. Functional ability and status Nutritional status Physical activity Advanced directives List of other physicians Hospitalizations, surgeries, and ER visits in previous 12 months Vitals Screenings to include cognitive, depression, and falls Referrals and appointments  In addition, I have reviewed and discussed with patient certain preventive protocols, quality metrics, and best practice recommendations. A written personalized care plan for preventive services as well as general preventive health recommendations were provided to patient.   Seabron Cypress [place], [place]   [id number]   After Visit Summary: (MyChart) Due to this being a telephonic visit, the after visit summary with patients personalized plan was offered to patient via MyChart   Notes: Please refer  to Routing Comments.

## 2023-10-05 NOTE — Patient Instructions (Signed)
 Rachel Russell , Thank you for taking time out of your busy schedule to complete your Annual Wellness Visit with me. I enjoyed our conversation and look forward to speaking with you again next year. I, as well as your care team,  appreciate your ongoing commitment to your health goals. Please review the following plan we discussed and let me know if I can assist you in the future. Your Game plan/ To Do List    Referrals:   You have an order for:  []   2D Mammogram  [x]   3D Mammogram  [x]   Bone Density     Please call for appointment:   [facility] [address] [place], [place] [address] [phone] -DEXA [phone] -MAMMO   Make sure to wear two-piece clothing.  No lotions, powders, or deodorants the day of the appointment. Make sure to bring picture ID and insurance card.  Bring list of medications you are currently taking including any supplements.   Follow up Visits: Next Medicare AWV with our clinical staff:In 1 year    Have you seen your provider in the last 6 months (3 months if uncontrolled diabetes)? No Next Office Visit with your provider: 10/19/23 @ 1:40   Clinician Recommendations:  Aim for 30 minutes of exercise or brisk walking, 6-8 glasses of water, and 5 servings of fruits and vegetables each day.       This is a list of the screening recommended for you and due dates:  Health Maintenance  Topic Date Due   Pneumonia Vaccine (1 of 2 - PCV) Never done   Cologuard (Stool DNA test)  Never done   Mammogram  Never done   Zoster (Shingles) Vaccine (1 of 2) Never done   DTaP/Tdap/Td vaccine (2 - Tdap) 09/09/2012   DEXA scan (bone density measurement)  Never done   Eye exam for diabetics  01/08/2023   Yearly kidney health urinalysis for diabetes  06/02/2023   Complete foot exam   06/02/2023   Hemoglobin A1C  07/12/2023   Flu Shot  12/22/2023   Yearly kidney function blood test for diabetes  01/09/2024   Medicare Annual  Wellness Visit  10/04/2024   HPV Vaccine  Aged Out   Meningitis B Vaccine  Aged Out   COVID-19 Vaccine  Discontinued    Advanced directives: (ACP Link)Information on Advanced Care Planning can be found at [place]  Secretary of State Advance Health Care Directives Advance Health Care Directives. [email or web address]   Advance Care Planning is important because it:  [x]  Makes sure you receive the medical care that is consistent with your values, goals, and preferences  [x]  It provides guidance to your family and loved ones and reduces their decisional burden about whether or not they are making the right decisions based on your wishes.  Follow the link provided in your after visit summary or read over the paperwork we have mailed to you to help you started getting your Advance Directives in place. If you need assistance in completing these, please reach out to us  so that we can help you!  See attachments for Preventive Care and Fall Prevention Tips.

## 2023-10-13 ENCOUNTER — Inpatient Hospital Stay (HOSPITAL_BASED_OUTPATIENT_CLINIC_OR_DEPARTMENT_OTHER): Admission: RE | Admit: 2023-10-13 | Source: Ambulatory Visit | Admitting: Radiology

## 2023-10-19 ENCOUNTER — Encounter: Payer: Self-pay | Admitting: Physician Assistant

## 2023-10-19 ENCOUNTER — Ambulatory Visit (INDEPENDENT_AMBULATORY_CARE_PROVIDER_SITE_OTHER): Admitting: Physician Assistant

## 2023-10-19 VITALS — BP 110/82 | HR 63 | Temp 97.8°F | Ht 61.0 in | Wt 163.0 lb

## 2023-10-19 DIAGNOSIS — N1832 Chronic kidney disease, stage 3b: Secondary | ICD-10-CM | POA: Diagnosis not present

## 2023-10-19 DIAGNOSIS — F3131 Bipolar disorder, current episode depressed, mild: Secondary | ICD-10-CM

## 2023-10-19 DIAGNOSIS — L2082 Flexural eczema: Secondary | ICD-10-CM | POA: Diagnosis not present

## 2023-10-19 DIAGNOSIS — Z86711 Personal history of pulmonary embolism: Secondary | ICD-10-CM | POA: Diagnosis not present

## 2023-10-19 DIAGNOSIS — F419 Anxiety disorder, unspecified: Secondary | ICD-10-CM | POA: Diagnosis not present

## 2023-10-19 DIAGNOSIS — E785 Hyperlipidemia, unspecified: Secondary | ICD-10-CM | POA: Diagnosis not present

## 2023-10-19 DIAGNOSIS — E1169 Type 2 diabetes mellitus with other specified complication: Secondary | ICD-10-CM | POA: Diagnosis not present

## 2023-10-19 DIAGNOSIS — E66812 Obesity, class 2: Secondary | ICD-10-CM | POA: Diagnosis not present

## 2023-10-19 DIAGNOSIS — E1122 Type 2 diabetes mellitus with diabetic chronic kidney disease: Secondary | ICD-10-CM

## 2023-10-19 DIAGNOSIS — K219 Gastro-esophageal reflux disease without esophagitis: Secondary | ICD-10-CM

## 2023-10-19 DIAGNOSIS — I10 Essential (primary) hypertension: Secondary | ICD-10-CM | POA: Diagnosis not present

## 2023-10-19 MED ORDER — TRIAMCINOLONE ACETONIDE 0.5 % EX OINT: 1.0000 | TOPICAL_OINTMENT | Freq: Two times a day (BID) | CUTANEOUS | 0 refills | Status: AC

## 2023-10-19 NOTE — Assessment & Plan Note (Signed)
 Recurrent hand eczema, likely exacerbated by weather changes. - Recommend OTC hydrocortisone for itching and rash. - Prescribe triamcinolone ointment for flare-ups, caution about potential skin lightening.

## 2023-10-19 NOTE — Assessment & Plan Note (Signed)
 Well controlled.  Continue to work on eating a healthy diet and exercise.  Labs drawn today.   No major side effects reported, and no issues with compliance. The current medical regimen is effective;  continue present plan with Farxiga  10mg , Ozempic  .25mg  Will adjust medication as needed depending on labs Lab Results  Component Value Date   HGBA1C 5.6 01/09/2023   HGBA1C 5.6 10/03/2022   HGBA1C 6.0 (H) 06/01/2022

## 2023-10-19 NOTE — Patient Instructions (Signed)
 VISIT SUMMARY:  During today's visit, we discussed your chronic tremor, recent improvements in stress levels, eye issue, skin condition, and post-surgical swelling. We also reviewed your recent lab results and general health maintenance, including vaccinations.  YOUR PLAN:  -ECZEMA: Eczema is a skin condition that causes itching, redness, and sometimes bleeding. For your hand eczema, I recommend using over-the-counter hydrocortisone for itching and rash. Additionally, I am prescribing triamcinolone ointment for flare-ups, but please be aware it may cause some skin lightening.  -TOTAL KNEE REPLACEMENT: Post-surgical swelling in your ankle is likely due to recent activity and the healing process from your knee replacement surgery. Continue to monitor the swelling and avoid strenuous activities that may exacerbate it.  -ELEVATED TRIGLYCERIDES: Elevated triglycerides are a type of fat found in your blood that can fluctuate with diet. We will continue to monitor your levels and discuss dietary changes if necessary.  -CHRONIC TREMOR: A chronic tremor is an involuntary, rhythmic muscle contraction leading to shaking movements in one or more parts of the body. We discussed and assessed your chronic tremor during this visit.  -GENERAL HEALTH MAINTENANCE: We discussed the importance of flu and pneumonia vaccinations. Despite your history of flu and COVID-19, it is important to stay updated with these vaccines. We will administer the flu and pneumonia vaccines later in the year.  INSTRUCTIONS:  Please follow up with your eye specialist in September as scheduled. We will also order blood work and a urine sample to monitor your kidney function and protein levels. Schedule a follow-up appointment with us  in six months unless lab results indicate the need for an earlier visit.

## 2023-10-19 NOTE — Assessment & Plan Note (Signed)
 Currently controlled with Dr. Nadeen Augusta Continue to follow up  Continue taking Lamictal 100mg , Ativan 1mg , and Paxil 40mg  Denies any major side effects or issues.  Mild Tremor noted on exam Patient states Dr. Nadeen Augusta is monitoring.

## 2023-10-19 NOTE — Assessment & Plan Note (Signed)
 Controlled Continue taking Xeralto 15mg  as directed Follow up with Cardiology in 1 year

## 2023-10-19 NOTE — Assessment & Plan Note (Signed)
 Continue to follow up with Dr. Nadeen Augusta Continue taking Lamictal 100mg , Ativan 1mg , Paxil 40mg  as prescribed Will continue to follow up with Dr. Nadeen Augusta.

## 2023-10-19 NOTE — Assessment & Plan Note (Signed)
 Comorbidity include: Hypertension, Hyperlipidemia, CKD 3b, PE Controlled Continue to monitor diet and exercise Labs drawn today Will adjust treatment based on labs

## 2023-10-19 NOTE — Progress Notes (Signed)
 Subjective:  Patient ID: Rachel Russell, female    DOB: 24-Mar-1956  Age: 68 y.o. MRN: [id number]  No chief complaint on file.   HPI:  Discussed the use of AI scribe software for clinical note transcription with the patient, who gave verbal consent to proceed.  History of Present Illness   Rachel Russell is a 68 year old female with a chronic tremor who presents for follow-up.  She has a chronic tremor, but specific details about its onset, location, or associated symptoms were not provided during this visit.  She notes a recent improvement in stress levels following her grandson's departure, which she believes has positively impacted her blood pressure. Her last A1c was 5.6, and her triglycerides were elevated, but kidney function remains stable with no proteinuria noted in a recent check-up earlier this year.  She describes an eye issue where a bubble in her eye popped, leaving a crater. She is under the care of an eye specialist and is scheduled for a follow-up in September. She is concerned about the healing process of the crater.  She reports a skin condition on her hand that itches and sometimes bleeds, resembling eczema. The condition flares up occasionally, feels like sandpaper, and has been present since last year but is not as severe as previous episodes.  She underwent total knee replacement surgery and describes some swelling in her ankle, which she attributes to the knee surgery. She experienced discomfort after walking down a steep driveway but denies any significant injury.  She lives with her grandson and manages household responsibilities after her husband's passing from congestive heart failure and kidney failure.          10/05/2023   10:22 AM 01/09/2023    8:21 AM 10/03/2022    8:11 AM 07/14/2022    3:06 PM 02/23/2022   11:09 AM  Depression screen PHQ 2/9  Decreased Interest 2 2 3 3 3   Down, Depressed, Hopeless 2 3 3 3 3   PHQ - 2 Score 4 5 6 6 6   Altered  sleeping 2 3 3  0 0  Tired, decreased energy 2 2 3 1 2   Change in appetite 3 3 3 2 3   Feeling bad or failure about yourself  2 3 3 3 3   Trouble concentrating 1 2 3 3 3   Moving slowly or fidgety/restless 0 3 0 0 3  Suicidal thoughts 0 0  0 0  PHQ-9 Score 14 21 21 15 20   Difficult doing work/chores  Not difficult at all Somewhat difficult Somewhat difficult Very difficult        10/05/2023    7:50 AM  Fall Risk   Falls in the past year? 0  Number falls in past yr: 0  Injury with Fall? 0  Risk for fall due to : No Fall Risks  Follow up Falls prevention discussed;Education provided;Falls evaluation completed    Patient Care Team: Odilia Bennett, [place] as PCP - General (Physician Assistant) [place], [facility] Christie Cox, MD as Consulting Physician (Orthopedic Surgery) Clementine Cutting, MD as Consulting Physician (Nephrology) Gillie Lacy, OD (Optometry)   Review of Systems  Constitutional:  Negative for appetite change, fatigue and fever.  HENT:  Negative for congestion, ear pain, sinus pressure and sore throat.   Respiratory:  Negative for cough, chest tightness, shortness of breath and wheezing.   Cardiovascular:  Negative for chest pain and palpitations.  Gastrointestinal:  Negative for abdominal pain, constipation, diarrhea, nausea and vomiting.  Genitourinary:  Negative for dysuria and hematuria.  Musculoskeletal:  Negative for arthralgias, back pain, joint swelling and myalgias.  Skin:  Negative for rash.  Neurological:  Negative for dizziness, weakness and headaches.  Psychiatric/Behavioral:  Negative for dysphoric mood. The patient is not nervous/anxious.     Current Outpatient Medications on File Prior to Visit  Medication Sig Dispense Refill   atorvastatin  (LIPITOR) 20 MG tablet Take 1 tablet (20 mg total) by mouth daily. 90 tablet 3   dapagliflozin  propanediol (FARXIGA ) 10 MG TABS tablet Take 1 tablet (10 mg total) by mouth daily before breakfast. 90  tablet 3   famotidine (PEPCID) 20 MG tablet Take 1 tablet by mouth daily.     LORazepam (ATIVAN) 1 MG tablet Take 1 mg by mouth 2 (two) times daily as needed.     PARoxetine (PAXIL) 40 MG tablet Take 20 mg by mouth at bedtime.     propranolol ER (INDERAL LA) 60 MG 24 hr capsule Take 60 mg by mouth daily.     Rivaroxaban  (XARELTO ) 15 MG TABS tablet Take 1 tablet (15 mg total) by mouth daily with supper. 90 tablet 3   Semaglutide ,0.25 or 0.5MG /DOS, (OZEMPIC , 0.25 OR 0.5 MG/DOSE,) 2 MG/3ML SOPN Inject 0.25 mg as directed once a week. INJECT 0.25MG  INTO THE SKIN ONE TIME PER WEEK 9 mL 3   traZODone (DESYREL) 150 MG tablet Take 150 mg by mouth at bedtime.     VITAMIN D  PO Take 5,000 Int'l Units/day by mouth daily.     fexofenadine  (ALLEGRA ) 180 MG tablet Take 1 tablet (180 mg total) by mouth daily. 30 tablet 5   lamoTRIgine (LAMICTAL) 100 MG tablet Take 300 mg by mouth daily. 1 in am and 2 in pm     No current facility-administered medications on file prior to visit.   Past Medical History:  Diagnosis Date   Allergic rhinitis    Allergy 1975   Anemia June 2023   Anxiety disorder    Arthritis Don't know   Bipolar disorder (HCC)    Chest pain 05/31/2015   Formatting of this note might be different from the original. Seen at [facility] ED 04/12/15 with normal EKG, troponin-2, CTA chest and discharged   Chronic kidney disease 2023   Clotting disorder [facility]) Not sure   Depression Don't remember   Dyslipidemia 02/09/2016   Essential (primary) hypertension    GERD without esophagitis    History of pulmonary embolism 02/09/2016   Hyperlipidemia    Obesity    Panic disorder (episodic paroxysmal anxiety)    Tachycardia 02/09/2016   Type 2 diabetes mellitus without complication (HCC)    Vitamin D  deficiency    Past Surgical History:  Procedure Laterality Date   CHOLECYSTECTOMY  2005   HERNIA REPAIR  2005   x2   JOINT REPLACEMENT  06/2022    Family History  Problem Relation Age of Onset    Leukemia Mother    Heart disease Mother        Pacemaker   Arthritis Mother    Cancer Mother    Hearing loss Mother    Hypertension Mother    Vision loss Mother    Diabetes Father    Heart disease Father        Stents   Hyperlipidemia Father    Varicose Veins Father    Asthma Brother    Early death Paternal Grandmother    Heart disease Paternal Grandmother    Anxiety disorder Maternal Aunt    Depression  Maternal Aunt    Asthma Brother    Early death Brother    Early death Son    Miscarriages / [place] Son    Hyperlipidemia Daughter    Anxiety disorder Maternal Aunt    Depression Maternal Aunt    Asthma Brother    Early death Brother    Early death Son    Miscarriages / [place] Son    Hyperlipidemia Daughter    Social History   Socioeconomic History   Marital status: Married    Spouse name: Vianny Schraeder   Number of children: 3   Years of education: Not on file   Highest education level: Some college, no degree  Occupational History   Not on file  Tobacco Use   Smoking status: Former    Current packs/day: 0.00    Average packs/day: 0.3 packs/day for 15.0 years (3.8 ttl pk-yrs)    Types: Cigarettes, Cigars    Start date: 11/2013    Quit date: 11/2018    Years since quitting: 4.9   Smokeless tobacco: Never  Vaping Use   Vaping status: Every Day  Substance and Sexual Activity   Alcohol use: Never   Drug use: Never   Sexual activity: Not Currently    Birth control/protection: None  Other Topics Concern   Not on file  Social History Narrative   Not on file   Social Drivers of Health   Financial Resource Strain: Medium Risk (10/05/2023)   Overall Financial Resource Strain (CARDIA)    Difficulty of Paying Living Expenses: Somewhat hard  Food Insecurity: Food Insecurity Present (10/05/2023)   Hunger Vital Sign    Worried About Running Out of Food in the Last Year: Often true    Ran Out of Food in the Last Year: Often true  Transportation Needs: No  Transportation Needs (10/05/2023)   PRAPARE - Administrator, Civil Service (Medical): No    Lack of Transportation (Non-Medical): No  Physical Activity: Insufficiently Active (10/05/2023)   Exercise Vital Sign    Days of Exercise per Week: 3 days    Minutes of Exercise per Session: 30 min  Stress: Stress Concern Present (10/05/2023)   [employer] of Occupational Health - Occupational Stress Questionnaire    Feeling of Stress : Very much  Social Connections: Moderately Isolated (10/05/2023)   Social Connection and Isolation Panel [NHANES]    Frequency of Communication with Friends and Family: More than three times a week    Frequency of Social Gatherings with Friends and Family: More than three times a week    Attends Religious Services: 1 to 4 times per year    Active Member of [employer] or Organizations: No    Attends Banker Meetings: Never    Marital Status: Widowed    Objective:  BP 110/82 (BP Location: Left Arm, Patient Position: Sitting)   Pulse 63   Temp 97.8 F (36.6 C) (Temporal)   Ht 5\' 1"  (1.549 m)   Wt 163 lb (73.9 kg)   SpO2 100%   BMI 30.80 kg/m      10/19/2023    2:04 PM 10/05/2023    8:35 AM 09/19/2023    4:23 PM  BP/Weight  Systolic BP 110 -- 110  Diastolic BP 82 -- 66  Wt. (Lbs) 163 165 165.6  BMI 30.8 kg/m2 31.18 kg/m2 31.29 kg/m2    Physical Exam Vitals reviewed.  Constitutional:      Appearance: Normal appearance.  Neck:  Vascular: No carotid bruit.  Cardiovascular:     Rate and Rhythm: Normal rate and regular rhythm.     Heart sounds: Normal heart sounds.  Pulmonary:     Effort: Pulmonary effort is normal.     Breath sounds: Normal breath sounds.  Abdominal:     General: Bowel sounds are normal.     Palpations: Abdomen is soft.     Tenderness: There is no abdominal tenderness.  Neurological:     Mental Status: She is alert and oriented to person, place, and time.     Motor: Tremor present.  Psychiatric:         Mood and Affect: Mood normal.        Behavior: Behavior normal.     Diabetic Foot Exam - Simple   Simple Foot Form Diabetic Foot exam was performed with the following findings: Yes 10/19/2023  2:33 PM  Visual Inspection No deformities, no ulcerations, no other skin breakdown bilaterally: Yes Sensation Testing Intact to touch and monofilament testing bilaterally: Yes Pulse Check Posterior Tibialis and Dorsalis pulse intact bilaterally: Yes Comments      Lab Results  Component Value Date   WBC 6.9 01/09/2023   HGB 11.6 01/09/2023   HCT 35.8 01/09/2023   PLT 296 01/09/2023   GLUCOSE 168 (H) 01/09/2023   CHOL 169 01/09/2023   TRIG 188 (H) 01/09/2023   HDL 40 01/09/2023   LDLCALC 97 01/09/2023   ALT 17 01/09/2023   AST 17 01/09/2023   NA 145 (H) 01/09/2023   K 5.0 01/09/2023   CL 107 (H) 01/09/2023   CREATININE 1.74 (H) 01/09/2023   BUN 19 01/09/2023   CO2 23 01/09/2023   TSH 1.070 02/23/2022   HGBA1C 5.6 01/09/2023      Assessment & Plan:  Type 2 diabetes mellitus with stage 3b chronic kidney disease, without long-term current use of insulin (HCC) Assessment & Plan: Well controlled.  Continue to work on eating a healthy diet and exercise.  Labs drawn today.   No major side effects reported, and no issues with compliance. The current medical regimen is effective;  continue present plan with Farxiga  10mg , Ozempic  .25mg  Will adjust medication as needed depending on labs Lab Results  Component Value Date   HGBA1C 5.6 01/09/2023   HGBA1C 5.6 10/03/2022   HGBA1C 6.0 (H) 06/01/2022      Orders: -     Hemoglobin A1c  Hyperlipidemia associated with type 2 diabetes mellitus (HCC) Assessment & Plan: Well controlled.  Continue to work on eating a healthy diet and exercise.  Labs drawn today.   No major side effects reported, and no issues with compliance. The current medical regimen is effective;  continue present plan with Lipitor 20mg  Lab Results  Component  Value Date   LDLCALC 97 01/09/2023     Orders: -     Lipid panel -     Microalbumin / creatinine urine ratio  Essential (primary) hypertension Assessment & Plan: Well controlled.  Continue to work on eating a healthy diet and exercise.  Labs drawn today.   No major side effects reported, and no issues with compliance. The current medical regimen is effective;  continue present plan with Propanolol 60 mg Will adjust medication as needed depending on labs BP Readings from Last 3 Encounters:  10/19/23 110/82  09/19/23 110/66  01/09/23 100/68     Orders: -     CBC with Differential/Platelet -     Comprehensive metabolic panel with GFR  GERD without esophagitis Assessment & Plan: Controlled Continue taking Pepcid 20mg  as directed Will let us  know if symptoms change so we can make adjustments   Stage 3b chronic kidney disease (CKD) (HCC) Assessment & Plan: Controlled Continue to follow up with Dr. Janan Mead with [facility] Continue taking Farxiga  40mg  Denies any major side effects   History of pulmonary embolism Assessment & Plan: Controlled Continue taking Xeralto 15mg  as directed Follow up with Cardiology in 1 year   Anxiety disorder, unspecified type Assessment & Plan: Continue to follow up with Dr. Nadeen Augusta Continue taking Lamictal 100mg , Ativan 1mg , Paxil 40mg  as prescribed Will continue to follow up with Dr. Nadeen Augusta.   Class 2 severe obesity due to excess calories with serious comorbidity and body mass index (BMI) of 37.0 to 37.9 in adult [facility]) Assessment & Plan: Comorbidity include: Hypertension, Hyperlipidemia, CKD 3b, PE Controlled Continue to monitor diet and exercise Labs drawn today Will adjust treatment based on labs   Bipolar affective disorder, currently depressed, mild (HCC) Assessment & Plan: Currently controlled with Dr. Nadeen Augusta Continue to follow up  Continue taking Lamictal 100mg , Ativan 1mg , and Paxil 40mg  Denies any major side  effects or issues.  Mild Tremor noted on exam Patient states Dr. Nadeen Augusta is monitoring.   Flexural eczema Assessment & Plan: Recurrent hand eczema, likely exacerbated by weather changes. - Recommend OTC hydrocortisone for itching and rash. - Prescribe triamcinolone  ointment for flare-ups, caution about potential skin lightening.  Orders: -     Triamcinolone  Acetonide; Apply 1 Application topically 2 (two) times daily.  Dispense: 30 g; Refill: 0    General Health Maintenance Discussed flu and pneumonia vaccinations. History of flu and COVID-19 despite vaccinations. - Administer flu and pneumonia vaccines later in the year.  Follow-up Discussed follow-up with specialists and routine labs. - Order blood work and urine sample to monitor kidney function and protein levels. - Schedule follow-up in six months unless lab results indicate otherwise.      Meds ordered this encounter  Medications   triamcinolone  ointment (KENALOG ) 0.5 %    Sig: Apply 1 Application topically 2 (two) times daily.    Dispense:  30 g    Refill:  0    Supervising Provider:   Mercy Stall [id number]    Orders Placed This Encounter  Procedures   CBC with Differential/Platelet   Comprehensive metabolic panel with GFR   Lipid panel   Hemoglobin A1c   Urine Albumin/Creatinine with ratio (send out) [LAB689]     Follow-up: Return in about 6 months (around 04/20/2024) for [place].   I,Lauren M Auman,acting as a Neurosurgeon for [employer], PA.,have documented all relevant documentation on the behalf of Odilia Bennett, PA,as directed by  Odilia Bennett, PA while in the presence of Odilia Bennett, [place].   An After Visit Summary was printed and given to the patient.  Odilia Bennett, [place] [facility] [phone]

## 2023-10-19 NOTE — Assessment & Plan Note (Signed)
 Well controlled.  Continue to work on eating a healthy diet and exercise.  Labs drawn today.   No major side effects reported, and no issues with compliance. The current medical regimen is effective;  continue present plan with Lipitor 20mg  Lab Results  Component Value Date   LDLCALC 97 01/09/2023

## 2023-10-19 NOTE — Assessment & Plan Note (Signed)
 Controlled Continue taking Pepcid 20mg  as directed Will let us  know if symptoms change so we can make adjustments

## 2023-10-19 NOTE — Assessment & Plan Note (Signed)
 Controlled Continue to follow up with Dr. Janan Mead with [facility] Continue taking Farxiga  40mg  Denies any major side effects

## 2023-10-19 NOTE — Assessment & Plan Note (Signed)
 Well controlled.  Continue to work on eating a healthy diet and exercise.  Labs drawn today.   No major side effects reported, and no issues with compliance. The current medical regimen is effective;  continue present plan with Propanolol 60 mg Will adjust medication as needed depending on labs BP Readings from Last 3 Encounters:  10/19/23 110/82  09/19/23 110/66  01/09/23 100/68

## 2023-10-20 LAB — COMPREHENSIVE METABOLIC PANEL WITH GFR
ALT: 9 IU/L (ref 0–32)
AST: 14 IU/L (ref 0–40)
Albumin: 4.2 g/dL (ref 3.9–4.9)
Alkaline Phosphatase: 102 IU/L (ref 44–121)
BUN/Creatinine Ratio: 11 — ABNORMAL LOW (ref 12–28)
BUN: 17 mg/dL (ref 8–27)
Bilirubin Total: 0.4 mg/dL (ref 0.0–1.2)
CO2: 20 mmol/L (ref 20–29)
Calcium: 9.9 mg/dL (ref 8.7–10.3)
Chloride: 106 mmol/L (ref 96–106)
Creatinine, Ser: 1.55 mg/dL — ABNORMAL HIGH (ref 0.57–1.00)
Globulin, Total: 2.4 g/dL (ref 1.5–4.5)
Glucose: 71 mg/dL (ref 70–99)
Potassium: 4 mmol/L (ref 3.5–5.2)
Sodium: 141 mmol/L (ref 134–144)
Total Protein: 6.6 g/dL (ref 6.0–8.5)
eGFR: 36 mL/min/{1.73_m2} — ABNORMAL LOW (ref >59)

## 2023-10-20 LAB — CBC WITH DIFFERENTIAL/PLATELET
Basophils Absolute: 0 10*3/uL (ref 0.0–0.2)
Basos: 1 %
EOS (ABSOLUTE): 0.1 10*3/uL (ref 0.0–0.4)
Eos: 2 %
Hematocrit: 36.2 % (ref 34.0–46.6)
Hemoglobin: 12.2 g/dL (ref 11.1–15.9)
Immature Grans (Abs): 0 10*3/uL (ref 0.0–0.1)
Immature Granulocytes: 0 %
Lymphocytes Absolute: 3.1 10*3/uL (ref 0.7–3.1)
Lymphs: 38 %
MCH: 32.4 pg (ref 26.6–33.0)
MCHC: 33.7 g/dL (ref 31.5–35.7)
MCV: 96 fL (ref 79–97)
Monocytes Absolute: 0.6 10*3/uL (ref 0.1–0.9)
Monocytes: 7 %
Neutrophils Absolute: 4.4 10*3/uL (ref 1.4–7.0)
Neutrophils: 52 %
Platelets: 330 10*3/uL (ref 150–450)
RBC: 3.76 x10E6/uL — ABNORMAL LOW (ref 3.77–5.28)
RDW: 12.8 % (ref 11.7–15.4)
WBC: 8.2 10*3/uL (ref 3.4–10.8)

## 2023-10-20 LAB — MICROALBUMIN / CREATININE URINE RATIO
Creatinine, Urine: 309.6 mg/dL
Microalb/Creat Ratio: 17 mg/g{creat} (ref 0–29)
Microalbumin, Urine: 52.4 ug/mL

## 2023-10-20 LAB — HEMOGLOBIN A1C
Est. average glucose Bld gHb Est-mCnc: 100 mg/dL
Hgb A1c MFr Bld: 5.1 % (ref 4.8–5.6)

## 2023-10-20 LAB — LIPID PANEL
Chol/HDL Ratio: 3.8 ratio (ref 0.0–4.4)
Cholesterol, Total: 163 mg/dL (ref 100–199)
HDL: 43 mg/dL (ref >39)
LDL Chol Calc (NIH): 99 mg/dL (ref 0–99)
Triglycerides: 116 mg/dL (ref 0–149)
VLDL Cholesterol Cal: 21 mg/dL (ref 5–40)

## 2023-10-22 ENCOUNTER — Other Ambulatory Visit: Payer: Self-pay | Admitting: Family Medicine

## 2023-10-22 ENCOUNTER — Ambulatory Visit: Payer: Self-pay | Admitting: Family Medicine

## 2023-10-22 MED ORDER — FAMOTIDINE 20 MG PO TABS: 20.0000 mg | ORAL_TABLET | Freq: Every day | ORAL | 3 refills | Status: AC

## 2023-10-25 ENCOUNTER — Other Ambulatory Visit (HOSPITAL_BASED_OUTPATIENT_CLINIC_OR_DEPARTMENT_OTHER): Admitting: Radiology

## 2023-10-25 ENCOUNTER — Inpatient Hospital Stay (HOSPITAL_BASED_OUTPATIENT_CLINIC_OR_DEPARTMENT_OTHER): Admission: RE | Admit: 2023-10-25 | Source: Ambulatory Visit | Admitting: Radiology

## 2023-12-06 DIAGNOSIS — F5101 Primary insomnia: Secondary | ICD-10-CM | POA: Diagnosis not present

## 2023-12-06 DIAGNOSIS — F410 Panic disorder [episodic paroxysmal anxiety] without agoraphobia: Secondary | ICD-10-CM | POA: Diagnosis not present

## 2023-12-06 DIAGNOSIS — F411 Generalized anxiety disorder: Secondary | ICD-10-CM | POA: Diagnosis not present

## 2023-12-06 DIAGNOSIS — F3132 Bipolar disorder, current episode depressed, moderate: Secondary | ICD-10-CM | POA: Diagnosis not present

## 2023-12-18 IMAGING — US US RENAL
1 series · 14 of 25 positions shown · non-contrast
Comparison: Retroperitoneal ultrasound April 11, 2019. CT scan
of the abdomen and pelvis without contrast April 12, 2015.

CLINICAL DATA: Chronic renal disease

EXAM:
RENAL / URINARY TRACT ULTRASOUND COMPLETE

[Series 1: us renal · 0.20mm/px · 14 of 43 slices shown]
[im 1/43]
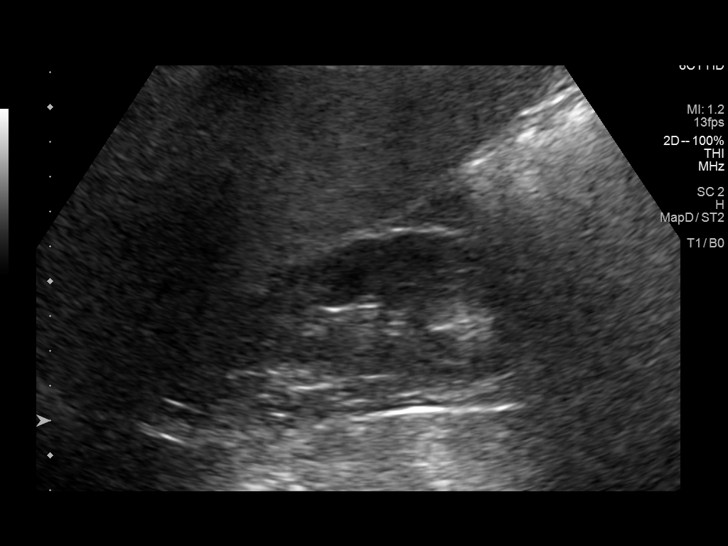
[im 4/43]
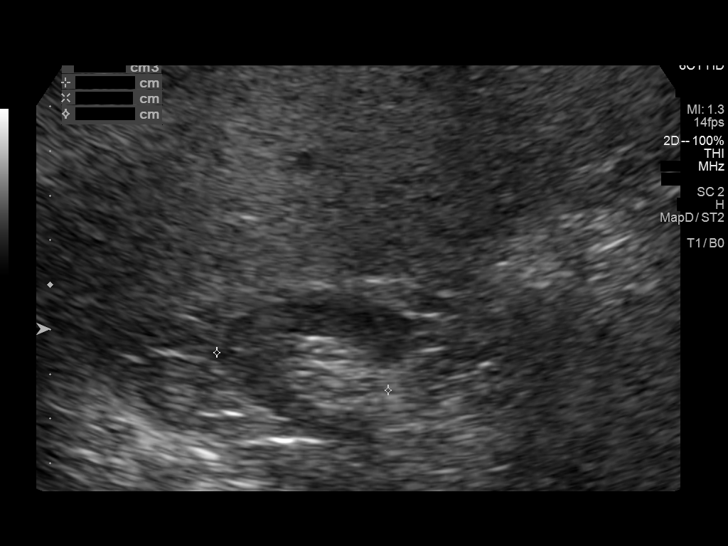
[im 8/43]
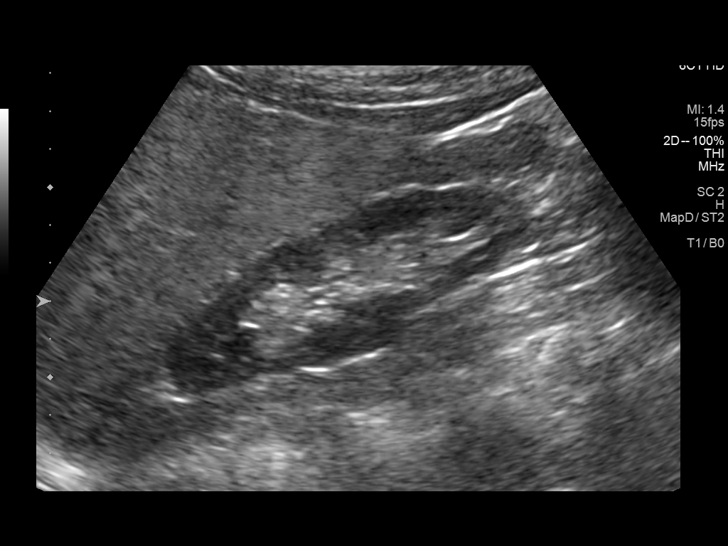
[im 11/43]
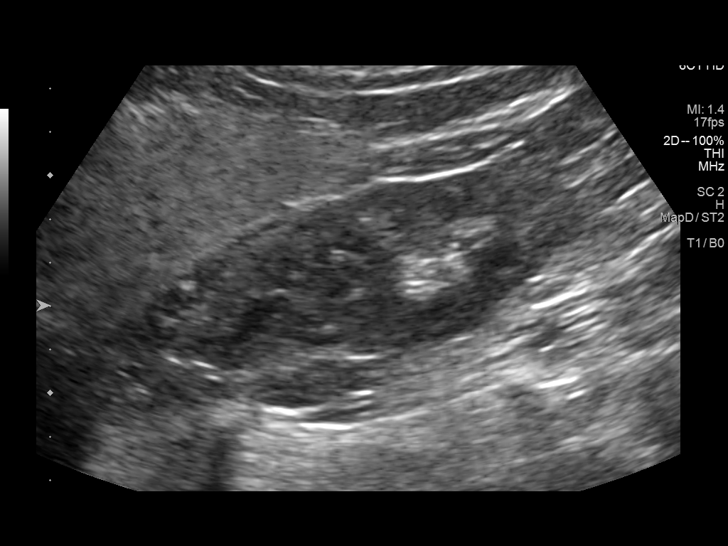
[im 15/43]
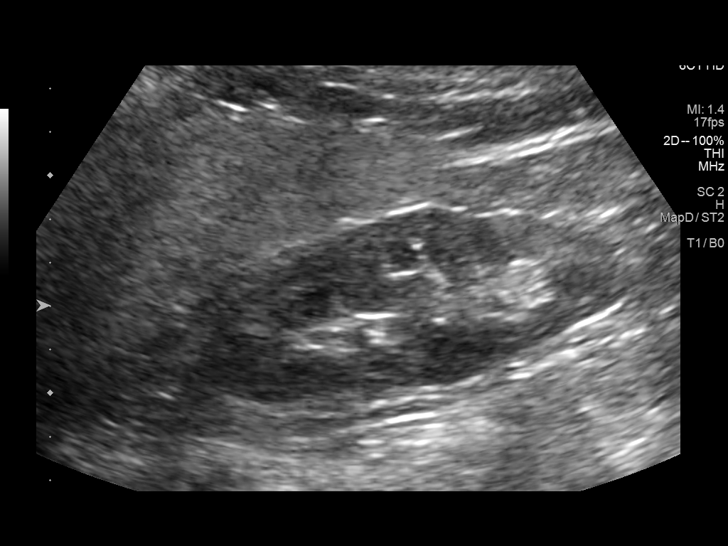
[im 16/43]
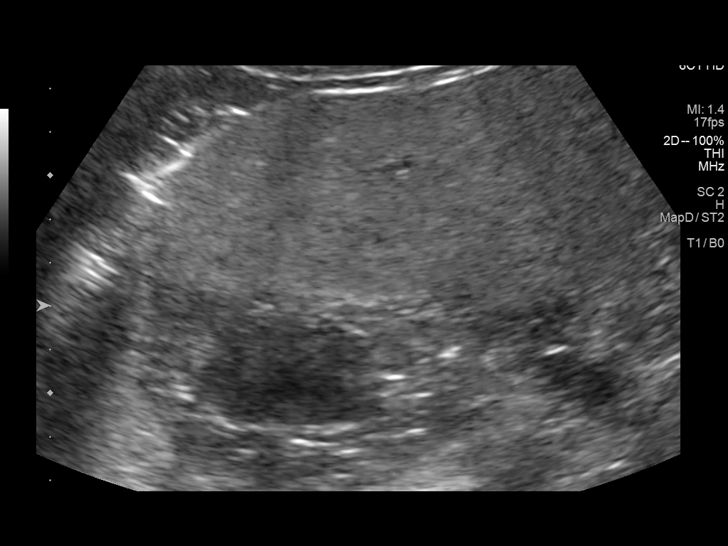
[im 20/43]
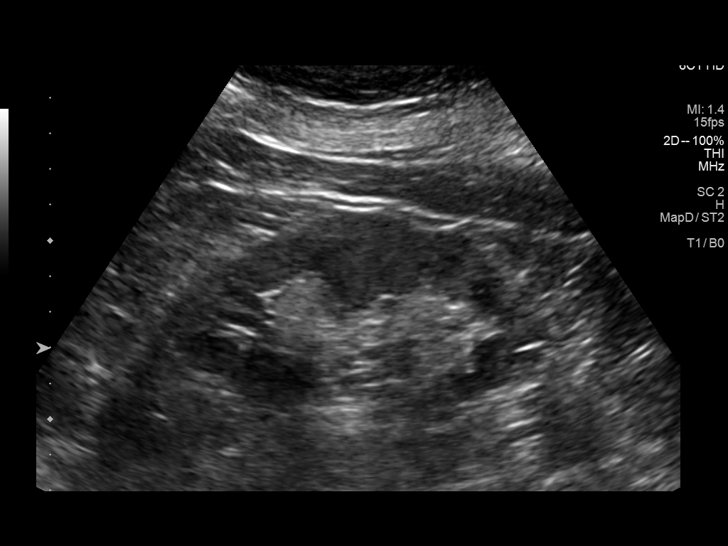
[im 23/43]
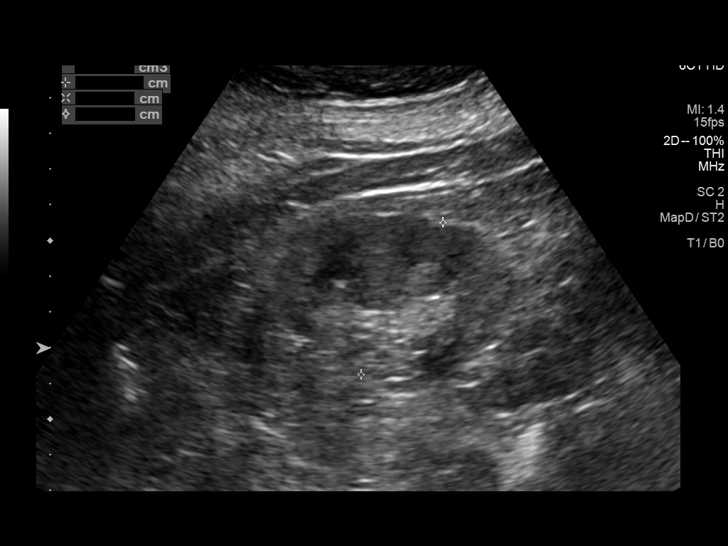
[im 27/43]
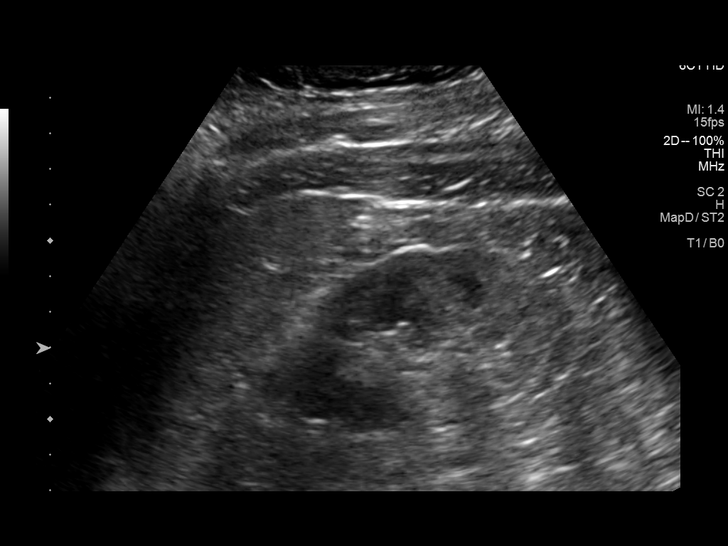
[im 29/43]
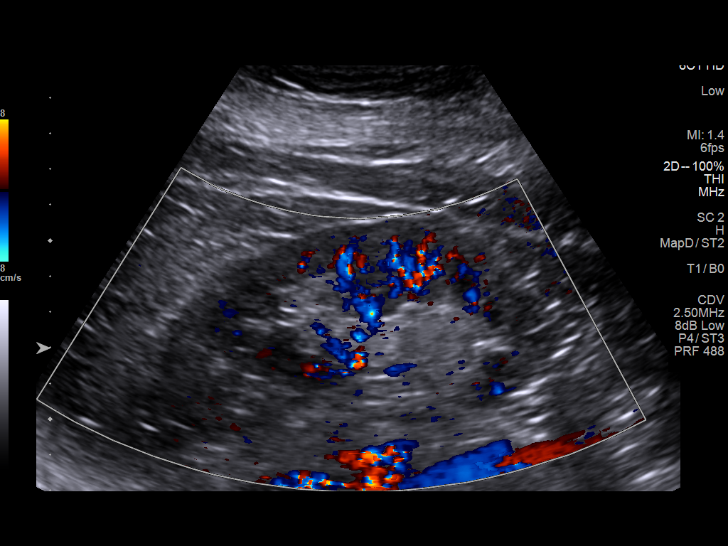
[im 32/43]
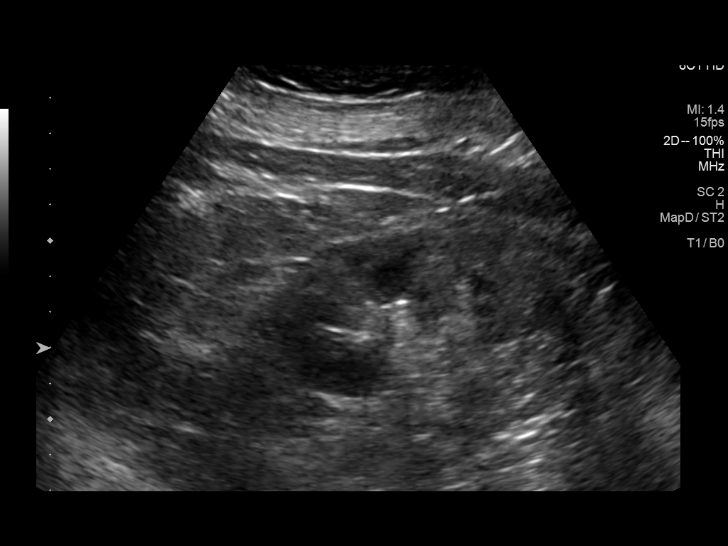
[im 36/43]
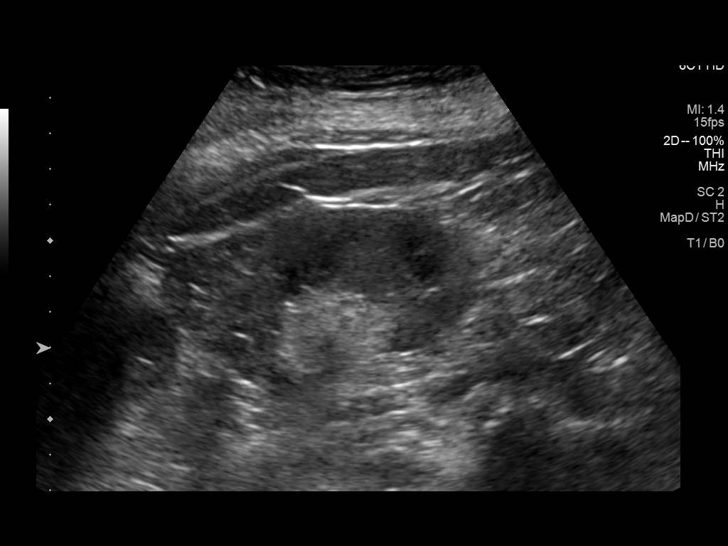
[im 39/43]
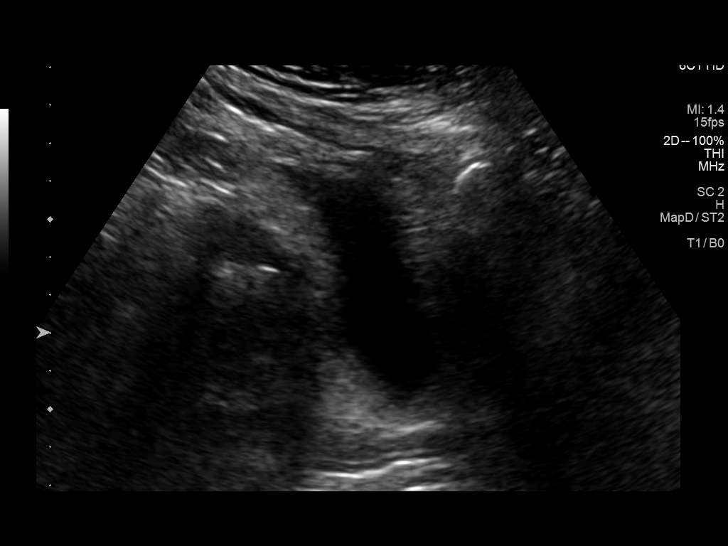
[im 43/43]
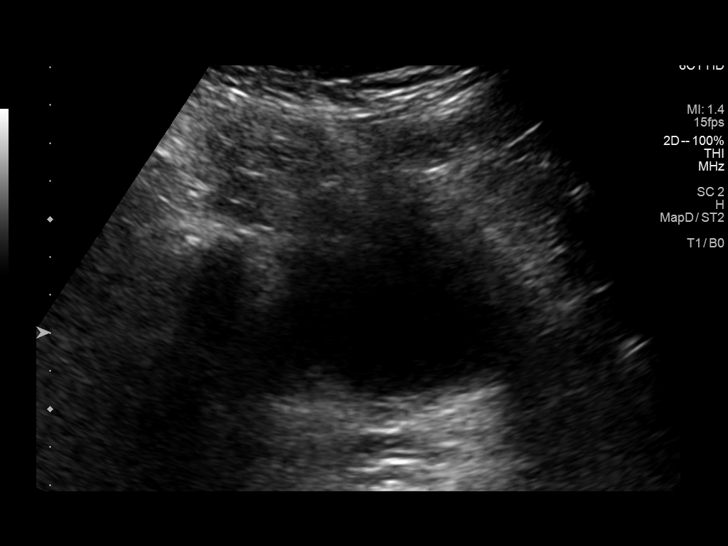

[14 of 25 positions shown; findings below may reference images not displayed]

FINDINGS: Right Kidney:

Renal measurements: 9.3 x 4.8 x 3.9 cm = volume: 92 mL. Echogenicity
within normal limits. No mass or hydronephrosis visualized.

Left Kidney:

Renal measurements: 12.0 x 5.9 x 4.8 cm = volume: 179 mL. Contains a
2.1 cm hypoechoic mass.

Bladder:

Appears normal for degree of bladder distention.

Other:

None.
IMPRESSION: 1. There is an indeterminate 2.1 cm hypoechoic mass in the left
kidney. Recommend MRI of the abdomen with without contrast for
better evaluation.
2. The right kidney and bladder are normal.

These results will be called to the ordering clinician or
representative by the Radiologist Assistant, and communication
documented in the PACS or [REDACTED].

## 2024-01-06 ENCOUNTER — Other Ambulatory Visit: Payer: Self-pay | Admitting: Physician Assistant

## 2024-01-06 DIAGNOSIS — Z86711 Personal history of pulmonary embolism: Secondary | ICD-10-CM

## 2024-01-13 ENCOUNTER — Other Ambulatory Visit: Payer: Self-pay | Admitting: Physician Assistant

## 2024-01-13 DIAGNOSIS — N289 Disorder of kidney and ureter, unspecified: Secondary | ICD-10-CM

## 2024-01-13 DIAGNOSIS — E1165 Type 2 diabetes mellitus with hyperglycemia: Secondary | ICD-10-CM

## 2024-01-13 DIAGNOSIS — N1832 Chronic kidney disease, stage 3b: Secondary | ICD-10-CM

## 2024-01-31 DIAGNOSIS — F5101 Primary insomnia: Secondary | ICD-10-CM | POA: Diagnosis not present

## 2024-01-31 DIAGNOSIS — F410 Panic disorder [episodic paroxysmal anxiety] without agoraphobia: Secondary | ICD-10-CM | POA: Diagnosis not present

## 2024-01-31 DIAGNOSIS — F411 Generalized anxiety disorder: Secondary | ICD-10-CM | POA: Diagnosis not present

## 2024-01-31 DIAGNOSIS — F3132 Bipolar disorder, current episode depressed, moderate: Secondary | ICD-10-CM | POA: Diagnosis not present

## 2024-02-21 ENCOUNTER — Telehealth: Payer: Self-pay | Admitting: Physician Assistant

## 2024-02-21 NOTE — Telephone Encounter (Signed)
 Rachel Russell,  Our office tried to contact you by phone regarding your upcoming appointment for 04/16/2024. Unfortunately, the appointment had to be canceled due to the provider being out of the office. Please call the office back at [phone] to get this appointment rescheduled.  Thank you for your time and we look forward to speaking with you.

## 2024-03-08 ENCOUNTER — Other Ambulatory Visit: Payer: Self-pay | Admitting: Physician Assistant

## 2024-03-08 DIAGNOSIS — N1832 Chronic kidney disease, stage 3b: Secondary | ICD-10-CM

## 2024-04-16 ENCOUNTER — Ambulatory Visit: Admitting: Physician Assistant

## 2024-05-01 DIAGNOSIS — F3132 Bipolar disorder, current episode depressed, moderate: Secondary | ICD-10-CM | POA: Diagnosis not present

## 2024-05-01 DIAGNOSIS — F410 Panic disorder [episodic paroxysmal anxiety] without agoraphobia: Secondary | ICD-10-CM | POA: Diagnosis not present

## 2024-05-01 DIAGNOSIS — F5101 Primary insomnia: Secondary | ICD-10-CM | POA: Diagnosis not present

## 2024-05-01 DIAGNOSIS — F411 Generalized anxiety disorder: Secondary | ICD-10-CM | POA: Diagnosis not present

## 2024-10-10 ENCOUNTER — Ambulatory Visit
